# Patient Record
Sex: Female | Born: 1955 | Race: White | Hispanic: No | Marital: Married | Smoking: Never smoker
Health system: Southern US, Community
[De-identification: ages and names within clinical notes are randomized; demographics above are authoritative.]

## PROBLEM LIST (undated history)

## (undated) DIAGNOSIS — M1990 Unspecified osteoarthritis, unspecified site: Secondary | ICD-10-CM

## (undated) DIAGNOSIS — I10 Essential (primary) hypertension: Secondary | ICD-10-CM

## (undated) DIAGNOSIS — E669 Obesity, unspecified: Secondary | ICD-10-CM

## (undated) DIAGNOSIS — N952 Postmenopausal atrophic vaginitis: Secondary | ICD-10-CM

## (undated) DIAGNOSIS — K219 Gastro-esophageal reflux disease without esophagitis: Secondary | ICD-10-CM

## (undated) DIAGNOSIS — N898 Other specified noninflammatory disorders of vagina: Principal | ICD-10-CM

## (undated) HISTORY — DX: Other specified noninflammatory disorders of vagina: N89.8

## (undated) HISTORY — PX: JOINT REPLACEMENT: SHX530

## (undated) HISTORY — DX: Postmenopausal atrophic vaginitis: N95.2

## (undated) HISTORY — DX: Unspecified osteoarthritis, unspecified site: M19.90

---

## 2008-02-23 ENCOUNTER — Encounter: Admission: RE | Admit: 2008-02-23 | Discharge: 2008-02-23 | Payer: Self-pay | Admitting: Orthopedic Surgery

## 2008-03-16 ENCOUNTER — Ambulatory Visit: Payer: Self-pay | Admitting: Cardiology

## 2008-06-15 ENCOUNTER — Inpatient Hospital Stay (HOSPITAL_COMMUNITY): Admission: RE | Admit: 2008-06-15 | Discharge: 2008-06-19 | Payer: Self-pay | Admitting: Orthopedic Surgery

## 2008-07-16 ENCOUNTER — Encounter: Admission: RE | Admit: 2008-07-16 | Discharge: 2008-09-13 | Payer: Self-pay | Admitting: Orthopedic Surgery

## 2008-11-09 ENCOUNTER — Inpatient Hospital Stay (HOSPITAL_COMMUNITY): Admission: RE | Admit: 2008-11-09 | Discharge: 2008-11-12 | Payer: Self-pay | Admitting: Orthopedic Surgery

## 2008-12-10 ENCOUNTER — Encounter: Admission: RE | Admit: 2008-12-10 | Discharge: 2009-03-10 | Payer: Self-pay | Admitting: Orthopedic Surgery

## 2009-05-04 HISTORY — PX: TOTAL KNEE ARTHROPLASTY: SHX125

## 2010-08-10 LAB — COMPREHENSIVE METABOLIC PANEL
AST: 23 U/L (ref 0–37)
Albumin: 2.7 g/dL — ABNORMAL LOW (ref 3.5–5.2)
Alkaline Phosphatase: 128 U/L — ABNORMAL HIGH (ref 39–117)
BUN: 13 mg/dL (ref 6–23)
BUN: 5 mg/dL — ABNORMAL LOW (ref 6–23)
GFR calc Af Amer: >60 mL/min (ref >60)
GFR calc non Af Amer: >60 mL/min (ref >60)
Glucose, Bld: 134 mg/dL — ABNORMAL HIGH (ref 70–99)
Potassium: 4.2 mEq/L (ref 3.5–5.1)
Potassium: 4.6 mEq/L (ref 3.5–5.1)
Total Bilirubin: 1.2 mg/dL (ref 0.3–1.2)
Total Protein: 6.9 g/dL (ref 6.0–8.3)

## 2010-08-10 LAB — URINALYSIS, ROUTINE W REFLEX MICROSCOPIC
Glucose, UA: NEGATIVE mg/dL
Nitrite: NEGATIVE
Specific Gravity, Urine: 1.026 (ref 1.005–1.030)
Urobilinogen, UA: 1 mg/dL (ref 0.0–1.0)
pH: 5 (ref 5.0–8.0)

## 2010-08-10 LAB — CBC
HCT: 29.4 % — ABNORMAL LOW (ref 36.0–46.0)
HCT: 29.5 % — ABNORMAL LOW (ref 36.0–46.0)
HCT: 30.4 % — ABNORMAL LOW (ref 36.0–46.0)
HCT: 39.6 % (ref 36.0–46.0)
Hemoglobin: 10.2 g/dL — ABNORMAL LOW (ref 12.0–15.0)
Hemoglobin: 10.6 g/dL — ABNORMAL LOW (ref 12.0–15.0)
Hemoglobin: 13.5 g/dL (ref 12.0–15.0)
MCHC: 34.6 g/dL (ref 30.0–36.0)
MCV: 84.2 fL (ref 78.0–100.0)
MCV: 84.3 fL (ref 78.0–100.0)
MCV: 84.6 fL (ref 78.0–100.0)
Platelets: 217 10*3/uL (ref 150–400)
RBC: 3.49 MIL/uL — ABNORMAL LOW (ref 3.87–5.11)
RBC: 3.62 MIL/uL — ABNORMAL LOW (ref 3.87–5.11)
RBC: 4.69 MIL/uL (ref 3.87–5.11)
RDW: 14.4 % (ref 11.5–15.5)
RDW: 14.6 % (ref 11.5–15.5)
WBC: 8.2 10*3/uL (ref 4.0–10.5)
WBC: 8.5 10*3/uL (ref 4.0–10.5)
WBC: 9.6 10*3/uL (ref 4.0–10.5)

## 2010-08-10 LAB — DIFFERENTIAL
Eosinophils Absolute: 0.4 10*3/uL (ref 0.0–0.7)
Lymphocytes Relative: 32 % (ref 12–46)
Lymphs Abs: 2.6 10*3/uL (ref 0.7–4.0)
Monocytes Relative: 8 % (ref 3–12)
Neutrophils Relative %: 55 % (ref 43–77)

## 2010-08-10 LAB — URINE CULTURE: Colony Count: 30000

## 2010-08-10 LAB — PROTIME-INR: INR: 1 (ref 0.00–1.49)

## 2010-08-10 LAB — BASIC METABOLIC PANEL
Chloride: 103 mEq/L (ref 96–112)
Creatinine, Ser: 0.7 mg/dL (ref 0.4–1.2)
Creatinine, Ser: 0.85 mg/dL (ref 0.4–1.2)
Potassium: 4.4 mEq/L (ref 3.5–5.1)
Sodium: 139 mEq/L (ref 135–145)

## 2010-08-10 LAB — APTT: aPTT: 29 seconds (ref 24–37)

## 2010-08-10 LAB — URINE MICROSCOPIC-ADD ON

## 2010-08-19 LAB — CBC
HCT: 28.8 % — ABNORMAL LOW (ref 36.0–46.0)
Hemoglobin: 10.2 g/dL — ABNORMAL LOW (ref 12.0–15.0)
Hemoglobin: 14.5 g/dL (ref 12.0–15.0)
Hemoglobin: 9.4 g/dL — ABNORMAL LOW (ref 12.0–15.0)
MCHC: 35.1 g/dL (ref 30.0–36.0)
MCHC: 35.5 g/dL (ref 30.0–36.0)
MCHC: 35 g/dL (ref 30.0–36.0)
MCV: 87.5 fL (ref 78.0–100.0)
MCV: 88.6 fL (ref 78.0–100.0)
Platelets: 313 10*3/uL (ref 150–400)
RBC: 3.25 MIL/uL — ABNORMAL LOW (ref 3.87–5.11)
RBC: 3.53 MIL/uL — ABNORMAL LOW (ref 3.87–5.11)
RBC: 3.05 MIL/uL — ABNORMAL LOW (ref 3.87–5.11)
RDW: 12.7 % (ref 11.5–15.5)
WBC: 7.8 10*3/uL (ref 4.0–10.5)

## 2010-08-19 LAB — APTT: aPTT: 31 seconds (ref 24–37)

## 2010-08-19 LAB — URINALYSIS, ROUTINE W REFLEX MICROSCOPIC
Glucose, UA: NEGATIVE mg/dL
Hgb urine dipstick: NEGATIVE
Protein, ur: NEGATIVE mg/dL
Urobilinogen, UA: 0.2 mg/dL (ref 0.0–1.0)
pH: 5 (ref 5.0–8.0)

## 2010-08-19 LAB — COMPREHENSIVE METABOLIC PANEL
ALT: 46 U/L — ABNORMAL HIGH (ref 0–35)
ALT: 22 U/L (ref 0–35)
AST: 19 U/L (ref 0–37)
Albumin: 4.1 g/dL (ref 3.5–5.2)
Albumin: 2.5 g/dL — ABNORMAL LOW (ref 3.5–5.2)
Alkaline Phosphatase: 101 U/L (ref 39–117)
BUN: 12 mg/dL (ref 6–23)
CO2: 30 mEq/L (ref 19–32)
Calcium: 8.4 mg/dL (ref 8.4–10.5)
GFR calc Af Amer: >60 mL/min (ref >60)
Potassium: 4.7 mEq/L (ref 3.5–5.1)
Sodium: 141 mEq/L (ref 135–145)
Sodium: 137 mEq/L (ref 135–145)
Total Protein: 6.8 g/dL (ref 6.0–8.3)
Total Protein: 5.5 g/dL — ABNORMAL LOW (ref 6.0–8.3)

## 2010-08-19 LAB — BASIC METABOLIC PANEL
CO2: 29 mEq/L (ref 19–32)
CO2: 30 mEq/L (ref 19–32)
Calcium: 8.1 mg/dL — ABNORMAL LOW (ref 8.4–10.5)
Calcium: 8.4 mg/dL (ref 8.4–10.5)
Chloride: 98 mEq/L (ref 96–112)
Creatinine, Ser: 0.77 mg/dL (ref 0.4–1.2)
Creatinine, Ser: 0.89 mg/dL (ref 0.4–1.2)
GFR calc Af Amer: >60 mL/min (ref >60)
GFR calc Af Amer: >60 mL/min (ref >60)
Glucose, Bld: 129 mg/dL — ABNORMAL HIGH (ref 70–99)
Potassium: 4 mEq/L (ref 3.5–5.1)
Sodium: 135 mEq/L (ref 135–145)
Sodium: 136 mEq/L (ref 135–145)
Sodium: 138 mEq/L (ref 135–145)

## 2010-08-19 LAB — ABO/RH: ABO/RH(D): A POS

## 2010-08-19 LAB — TYPE AND SCREEN
ABO/RH(D): A POS
Antibody Screen: NEGATIVE

## 2010-08-19 LAB — URINE MICROSCOPIC-ADD ON

## 2010-08-19 LAB — URINE CULTURE

## 2010-09-16 NOTE — Op Note (Signed)
NAMEHENDY, [name]                 ACCOUNT NO.:  [id number]   MEDICAL RECORD NO.:  [id number]          PATIENT TYPE:  INP   LOCATION:  5003                         FACILITY:  [facility]   PHYSICIAN:  Dyke Brackett, M.D.    DATE OF BIRTH:  1956-03-21   DATE OF PROCEDURE:  06/15/2008  DATE OF DISCHARGE:                               OPERATIVE REPORT   INDICATIONS:  A 55 year old morbid obesity, 5 feet 5 inches,  approximately 300 pounds with a BMI of greater than 50 making technical  degree of difficulty this case as a primary knee significant a greater  than that normal.   PREOPERATIVE DIAGNOSES:  1. Severe osteoarthritis, left knee.  2. Morbid obesity.   POSTOPERATIVE DIAGNOSES:  1. Severe osteoarthritis, left knee.  2. Morbid obesity.   OPERATION:  1. DePuy LCS cemented knee (standard plus femur-patella, 15-mm bearing      with a size 4 tibial tray.  2. Repair of patellar tendon takedown.   SURGEON:  Dyke Brackett, MD   ASSISTANT:  Sharol Given, PA   TOURNIQUET TIME:  1 hour 10 minutes.   PROCEDURE:  Extended approach to the knee was made in terms of  undermining tissue through a straight skin incision, medial parapatellar  approach.  Exposure was significantly more difficult due to the size of  the knee.  A medial parapatellar approach to the knee was made and a  limited patellar turndown carried out distally and later repaired with a  Mitek anchor due to difficulty of exposure.  Tibia was cut about 2 mm  below the most diseased medial compartment with a 7-degree posterior  slope.  This was followed by an anterior-posterior femoral cut with a  flexion gap eventually measured at 15 mm followed by a distal 4-degree  valgus cut.  Finishing guide was next placed with the keel holes for  that as well as the chamfers.   Attention was next directed at the femur of the tibia.  Tibia was  exposed and excess remnants of the menisci were removed.  PCL was  completely released.   Keel hole was cut for the LCS and a trial tibial  baseplate and bearing eventually sized to 15 mm was placed with a trial  femur.  Patella was cut due to again difficulty exposing the patella  with a freehand technique leaving 13 mm of native patella to accept  patellar trial.  Slight hyperextension was noted with the trial in but  no instability noted.  Flexion to 100 limited by the patient's size and  again good stability to varus and valgus.  Trials were removed.  The  final components were inserted with cement in the doughy state,  2  batches of cement with 1.2 g of vancomycin per batch using antibiotic-  impregnated cement.  Cement was allowed to harden, excess cement was  removed, and tibia was cemented followed by femur patella.  A trial  bearing was placed to allow exposure of the posterior history of the  knee.  When the cement was hardened, this trial bearing was  removed.  Tourniquet was released.  No excessive bleeding was noted.  Excess  cement was removed for the back of the knee, then the final bearing  placed and again all parameters were checked with the knee extended and  flexed.  The patellar retinaculum and capsule was closed with  interrupted Ethibond.  Limited takedown of the patellar tendon was  repaired with a Mitek anchor at the attachment of the patellar tendon,  subcutaneous tissues with 0 and 2-0 Vicryl skin clips on the skin  distally, Hemovac drain exiting superolaterally.  She was taken to the  recovery room in stable condition.      Dyke Brackett, M.D.  Electronically Signed     WDC/MEDQ  D:  06/15/2008  T:  06/15/2008  Job:  [id number]

## 2010-09-16 NOTE — Assessment & Plan Note (Signed)
[facility]                            CARDIOLOGY OFFICE NOTE   NAME:[name], [name]                        MRN:          [id number]  DATE:03/16/2008                            DOB:          04/02/1956    PRIMARY CARE PHYSICIAN:  Lindaann Pascal, PA, [facility].   REFERRING PHYSICIAN:  Dyke Brackett, M.D.   REASON FOR PRESENTATION:  Preoperative evaluation in a patient with  cardiovascular risk factors.   HISTORY OF PRESENT ILLNESS:  The patient is a pleasant 55 year old white  female without prior cardiac history.  She does have borderline  hypertension.  She has morbid obesity.  She is limited in her functional  status by knee pain.  She is going to have surgery on one or both of her  knees.  This has not yet been scheduled.  She is able to climb a flight  of stairs slowly.  She can vacuum (greater than 5 minutes).  With this  level of activity she has never had any chest discomfort, neck or arm  discomfort.  She has never had any chest pressure, palpitation,  presyncope or syncope.  She thinks her breathing is okay.  She does not  describe any PND or orthopnea.  She has recently had some borderline  blood pressures and some proteinuria.  These are both being followed.   PAST MEDICAL HISTORY:  Borderline hypertension as described.  There has  been no history of diabetes or hyperlipidemia.   PAST SURGICAL HISTORY:  None.   ALLERGIES:  PENICILLIN.   MEDICATIONS:  Meloxicam, hydrocodone.   SOCIAL HISTORY:  The patient is a housewife.  She is married.  She has  no children.  She has never smoked cigarettes.  She does not drink  alcohol.   FAMILY HISTORY:  Is contributory for a brother with diabetes who had  stents placed in his 37s.  Her father had rheumatic heart disease.  He  has valve replacement.  He also apparently has an ICD and had later  onset coronary disease in his 58s.   REVIEW OF SYSTEMS:  As stated in the  HPI, positive for occasional  headaches and constipation, snoring, joint pains as described.  Negative  for all other systems.   PHYSICAL EXAMINATION:  GENERAL:  The patient is in no distress.  VITAL SIGNS:  Blood pressure 124/84, heart rate 69 and regular.  HEENT:  Eyes unremarkable.  Pupils equal, round and reactive to light.  Fundi within normal limits.  Oral mucosa unremarkable.  NECK:  No jugular venous distention at 45 degrees, carotid upstroke  brisk and symmetrical.  No bruits, no thyromegaly.  LYMPHATICS:  No cervical, axillary or inguinal adenopathy.  LUNGS:  Clear to auscultation bilaterally.  BACK:  No costovertebral angle tenderness.  CHEST:  Unremarkable.  HEART:  PMI not displaced or sustained, S1-S2 within normal limits, no  S3, no S4, no clicks, no rubs, no murmurs.  ABDOMEN:  Obese, positive bowel sounds, normal in frequency and pitch,  no bruits, no rebound, no guarding or midline pulsatile mass.  No  hepatomegaly.  No splenomegaly.  SKIN:  No rashes, no nodules.  EXTREMITIES:  2+ pulses throughout, no edema, cyanosis or clubbing.  NEURO:  Oriented to person, place, time.  Cranial nerves II-XII grossly  intact, motor grossly intact.   EKG sinus rhythm, rate 69, incomplete right bundle branch block, no  acute ST-T wave changes.   ASSESSMENT AND PLAN:  1. Preoperative clearance.  The patient has a reasonable functional      level (greater than 5 minutes).  She has no high risk physical      findings or history.  She has no ongoing symptoms.  She is going      for moderate risk surgical procedure.  Therefore, based on this and      according to ACC/AHA guidelines, the patient is at acceptable risk      for the planned surgery.  No further cardiovascular testing is      suggested.  She should have usual preoperative and postoperative      precautions taken.  2. Obesity.  We discussed the need to lose weight with diet and      exercise.  Hopefully she can comply  with this as her knees get      fixed.  3. Hypertension.  This is borderline.  Certainly the first step would      be weight loss.  She does snore but does not have any evidence      otherwise of sleep apnea.  This could be considered though if she      has blood pressure in the future.   FOLLOWUP:  I would be happy to see her as needed.     Rollene Rotunda, MD, [facility]  Electronically Signed    JH/MedQ  DD: 03/16/2008  DT: 03/16/2008  Job #: [id number]   cc:   Dyke Brackett, M.D.

## 2010-09-16 NOTE — Discharge Summary (Signed)
[name], [name]                 ACCOUNT NO.:  [id number]   MEDICAL RECORD NO.:  [id number]          PATIENT TYPE:  INP   LOCATION:  5003                         FACILITY:  [facility]   PHYSICIAN:  Dyke Brackett, M.D.    DATE OF BIRTH:  04-Feb-1956   DATE OF ADMISSION:  06/15/2008  DATE OF DISCHARGE:  06/19/2008                               DISCHARGE SUMMARY   ADMITTING DIAGNOSIS:  Left knee osteoarthritis.   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   PROCEDURE:  The patient taken to the OR on June 15, 2008, for left  knee osteoarthritis, had a left total knee replacement and transferred  to the PACU in stable condition.   HOSPITAL COURSE:  Ms. [name] is a 55 year old female who comes to the  hospital for left knee osteoarthritis, taken to the OR, had a left total  knee replacement, and patellar tendon repair.  The patient was then  brought to the PACU in stable condition.  First postoperative day was on  June 16, 2008, the patient was doing well.  Hemoglobin 10.9.  Vital  signs were stable, afebrile.  No complaints.  Minimal pain.  Second  postop day was on June 17, 2008, vital signs stable, afebrile once  again.  Hemoglobin 10.2, white blood cell count 9.2.  Hemovac was pulled  and dressing was changed and CPM 0-90, doing well, 50% weightbearing  with physical therapy.  Plan to discharge home on Monday.  Postoperative  day #3 was on June 18, 2008, the patient was still using the pain  pump as apparently they had discontinued the PCA and having a fair  amount of pain with pain pumps between 7-8 level roughly, due to this we  decided that we need to discharge the pain pump, at least one day prior  to sending her home.  Hemoglobin is 9.4.  Vital signs were stable,  afebrile.  Wound was benign and had negative Homans sign on exam.  She  is getting 0-90 in CPMs and doing 50% weightbearing, plan to go home on  June 19, 2008.  Postoperative day #4 was June 19, 2008,  hemoglobin was 9.4 and stable.  Vital signs were stable, afebrile.  The  patient doing better with pain off the pain pump, therefore I plan to  discharge home, 50% weightbearing and follow up in Dr. Candise Bowens office  in 10 days for staple removal.   ASSESSMENT AND PLAN:  Ms. [name] is a 55 year old female status post  left total knee replacement.  Discharged home in stable condition on  regular diet, 50% weightbearing as tolerated on left total knee  replacement, CPM 0-90, 6-8 hours a day increase 2-3 degrees per day.  Plan for follow up at Dr. Candise Bowens office in 10 days for staple  removal.  The patient got home health physical therapy, be on Lovenox  for 14 days postoperatively.   DISCHARGE MEDICATIONS:  1. Lovenox 40 mg subcu at 8 a.m. each day for a total of 14 days      postoperatively.  2. Robaxin 500 mg 1 tablet p.o. q.6-8 h. p.r.n. pain.  3. Percocet 5/325 one to two tablets p.o. q.4-6 h. p.r.n. pain.   Apparently, the patient had no other home medications, it just the ones  that we started on.      Sharol Given, [place]      Dyke Brackett, M.D.  Electronically Signed    JBS/MEDQ  D:  07/25/2008  T:  07/26/2008  Job:  [id number]

## 2010-09-16 NOTE — Discharge Summary (Signed)
NAMEJOEY, HUDOCK                 ACCOUNT NO.:  [id number]   MEDICAL RECORD NO.:  [id number]          PATIENT TYPE:  INP   LOCATION:  5037                         FACILITY:  [facility]   PHYSICIAN:  Dyke Brackett, M.D.    DATE OF BIRTH:  10/05/1955   DATE OF ADMISSION:  11/09/2008  DATE OF DISCHARGE:  11/12/2008                               DISCHARGE SUMMARY   ADMISSION DIAGNOSIS:  Right knee osteoarthritis.   DISCHARGING FINAL DIAGNOSES:  Status post right total knee replacement  and right knee osteoarthritis.   PROCEDURE NOTE:  The patient was brought to the operating room.  Right  total knee replacement was performed by Dr. Madelon Lips and Kateri Plummer,  PA, assistant.  The patient tolerated procedure well.  No complications.  She was transferred to the PACU in stable condition.  Minimal blood  loss.   HOSPITAL COURSE:  Ms. Weiler is a 55 year old female.  Postoperative day  #1 was on November 10, 2008.  The patient was doing well at that point.  Hemoglobin at 10.6.  Vital signs were stable.  She was afebrile having  some minimal tenderness in her right lower leg, otherwise everything was  going fine at that point.  Therapy started and the patient was 50%  weightbearing on postoperative day #2 on November 11, 2008.  Hemoglobin at  10.2.  Vital signs stable, afebrile.  Exam is benign.  Postoperative day  #3 was on November 12, 2008, passing gas.  At that point, no bowel movement.  Hemoglobin 10.2.  Vital signs stable, afebrile, getting to 55 degrees  with CPM machine at that point.  Plan for discharge today.  She was  doing well.   ASSESSMENT/PLAN:  Ms. Couillard is a 55 year old female status post right  total knee replacement discharged on November 12, 2008, in stable condition,  50% weightbearing, follow up in the office 14 days postoperatively for  staple removal, Lovenox postoperatively for 14 days 40 mg subcu daily at  8 a.m. for DVT prophylaxis and the patient's discharge medications would  be as  follows:  1. Lovenox 40 mg subcu at 8 a.m. each day for a total of as mentioned      14 days postoperatively.  2. Norco 7.5/325 one to two tablets p.o. q.4-6 h. p.r.n. pain.  3. Robaxin 500 mg 1 tablet p.o. q.6-8 h. p.r.n. pain.  4. Meloxicam was not restarted actually and her vitamin and laxatives      was so.      Sharol Given, PA      Dyke Brackett, M.D.  Electronically Signed    JBS/MEDQ  D:  11/28/2008  T:  11/29/2008  Job:  [id number]

## 2010-09-16 NOTE — Op Note (Signed)
NAMEANAYELLI, LAI                 ACCOUNT NO.:  [id number]   MEDICAL RECORD NO.:  [id number]          PATIENT TYPE:  INP   LOCATION:  5037                         FACILITY:  [facility]   PHYSICIAN:  Dyke Brackett, M.D.    DATE OF BIRTH:  [birth year]   DATE OF PROCEDURE:  11/09/2008  DATE OF DISCHARGE:                               OPERATIVE REPORT   PREOPERATIVE DIAGNOSES:  1. Morbid obesity.  2. Severe osteoarthritis, right knee.   POSTOPERATIVE DIAGNOSES:  1. Morbid obesity.  2. Severe osteoarthritis, right knee.   OPERATION:  Right total knee replacement (LCS standard plus femur, size  4 tibia, 15-mm bearing standard plus patella).   SURGEON:  Dyke Brackett, MD   ASSISTANT:  Joslyn Devon. Smith, PA   TOURNIQUET TIME:  102 minutes.   DESCRIPTION OF PROCEDURE:  It should be noted that technical degree of  difficulty due to the patient's height approximately 5 and 280 pounds  made the technical degree of difficulty in this case significant,  although the case was accomplished without complications.  She was  approached to an extended anterior incision due to the patient's body  habitus.  A straight skin incision extended, medial parapatellar  approach to the knee was made, patella could not be everted.  It was  retracted laterally.  There was significant osteoarthritis with complete  collapse of the medial side of the knee, was also aware laterally and  patellofemorally was severe, tibia was cut, and revision tibia cut to  allow creation of a flexion gap, eventually 15 mm.  The anterior-  posterior femoral cut was cut with a match to create a flexion gap, then  a 4-degree distal cut with the flexion gap eventually measuring the  extension gap at 15 mm.  Medial stripping of the medial compartment was  carried out due to contracture of the medial side, and actually this had  to be done on the lateral side as well to balance the knee as well.   After cutting the distal femur, the  chamfers were cut as well as the  pegs for the prosthesis.  Attention was next directed to the tibia.  Again, noted contraction of the structures particularly due to the  posterior sagging of the femur due to the patient's size, made  manipulation of the knee extremely difficult, although no complications  were encountered.  The keel hole was cut through the tibia after excess  menisci were removed, and then the size 4 trial placed with a standard  plus femur, patella 3-peg trial.  Once all the releases and balancing  was carried out, the knee did obtain full extension with slight  hyperextension and flexed with slightly positive drawer with no tendency  for bearing spin-out.  The components were removed.  Wound was  irrigated, pulsatile lavage used on the bone, cement was placed into the  tibia and femur, components were cemented into place with final bearing  at the above-mentioned size.  Excess cement was removed from the knee.  Again, balancing was deemed to be excellent with full  extension,  slight hyperextension, good varus and valgus stability.  No  tendency for bearing spin-out.  In view of the preoperative femoral  nerve block, no Marcaine was used.  Closure on the capsule was effected  with #1 Ethibond with 0 and 2-0 Vicryl.  Hemovac drain was placed and  skin staples, taken to recovery room in stable condition.       Dyke Brackett, M.D.  Electronically Signed     WDC/MEDQ  D:  11/09/2008  T:  11/09/2008  Job:  [id number]

## 2011-06-13 ENCOUNTER — Emergency Department (HOSPITAL_COMMUNITY)
Admission: EM | Admit: 2011-06-13 | Discharge: 2011-06-14 | Disposition: A | Discharge status: Home / Self Care | Payer: Commercial Indemnity | Attending: Emergency Medicine | Admitting: Emergency Medicine

## 2011-06-13 ENCOUNTER — Encounter (HOSPITAL_COMMUNITY): Payer: Self-pay | Admitting: *Deleted

## 2011-06-13 ENCOUNTER — Emergency Department (HOSPITAL_COMMUNITY): Payer: Commercial Indemnity

## 2011-06-13 DIAGNOSIS — R1011 Right upper quadrant pain: Secondary | ICD-10-CM | POA: Insufficient documentation

## 2011-06-13 DIAGNOSIS — E669 Obesity, unspecified: Secondary | ICD-10-CM | POA: Insufficient documentation

## 2011-06-13 DIAGNOSIS — K8020 Calculus of gallbladder without cholecystitis without obstruction: Secondary | ICD-10-CM

## 2011-06-13 DIAGNOSIS — R1110 Vomiting, unspecified: Secondary | ICD-10-CM | POA: Insufficient documentation

## 2011-06-13 HISTORY — DX: Obesity, unspecified: E66.9

## 2011-06-13 LAB — HEPATIC FUNCTION PANEL
Bilirubin, Direct: 0.9 mg/dL — ABNORMAL HIGH (ref 0.0–0.3)
Indirect Bilirubin: 0.7 mg/dL (ref 0.3–0.9)
Total Bilirubin: 1.6 mg/dL — ABNORMAL HIGH (ref 0.3–1.2)

## 2011-06-13 LAB — DIFFERENTIAL
Basophils Absolute: 0 10*3/uL (ref 0.0–0.1)
Basophils Relative: 0 % (ref 0–1)
Eosinophils Relative: 2 % (ref 0–5)
Monocytes Absolute: 1 10*3/uL (ref 0.1–1.0)

## 2011-06-13 LAB — CBC
HCT: 41.7 % (ref 36.0–46.0)
MCH: 29.3 pg (ref 26.0–34.0)
MCHC: 34.3 g/dL (ref 30.0–36.0)
MCV: 85.5 fL (ref 78.0–100.0)
RDW: 13.4 % (ref 11.5–15.5)

## 2011-06-13 LAB — URINALYSIS, ROUTINE W REFLEX MICROSCOPIC
Hgb urine dipstick: NEGATIVE
Ketones, ur: 15 mg/dL — AB
Protein, ur: NEGATIVE mg/dL
Urobilinogen, UA: >8 mg/dL — ABNORMAL HIGH (ref 0.0–1.0)

## 2011-06-13 LAB — URINE MICROSCOPIC-ADD ON

## 2011-06-13 LAB — POCT I-STAT, CHEM 8
Calcium, Ion: 1.12 mmol/L (ref 1.12–1.32)
Creatinine, Ser: 0.7 mg/dL (ref 0.50–1.10)
Hemoglobin: 15 g/dL (ref 12.0–15.0)
Sodium: 140 mEq/L (ref 135–145)
TCO2: 28 mmol/L (ref 0–100)

## 2011-06-13 MED ORDER — MORPHINE SULFATE 4 MG/ML IJ SOLN
4.0000 mg | Freq: Once | INTRAMUSCULAR | Status: AC
  Administered 2011-06-13: 4 mg via INTRAVENOUS
  Filled 2011-06-13: qty 1

## 2011-06-13 MED ORDER — IOHEXOL 300 MG/ML  SOLN
100.0000 mL | Freq: Once | INTRAMUSCULAR | Status: AC | PRN
  Administered 2011-06-13: 100 mL via INTRAVENOUS

## 2011-06-13 MED ORDER — SODIUM CHLORIDE 0.9 % IV BOLUS (SEPSIS)
1000.0000 mL | Freq: Once | INTRAVENOUS | Status: AC
  Administered 2011-06-13: 1000 mL via INTRAVENOUS

## 2011-06-13 NOTE — ED Provider Notes (Signed)
History     CSN: [id number]  Arrival date & time 06/13/11  7829   First MD Initiated Contact with Patient 06/13/11 1908      Chief Complaint  Patient presents with  . Abdominal Pain    (Consider location/radiation/quality/duration/timing/severity/associated sxs/prior treatment) HPI Patient is a 56 year old female who presents today complaining of several days of right upper quadrant abdominal pain. She denies any nausea or vomiting. She says the pain is a 10 out of 10. It is worse with palpation as well as breathing. She has no history of prior abdominal surgeries. She denies any urinary symptoms or vaginal discharge. Patient does not have any associated chest pain or cough. She denies any fevers. She has had this one other time and thought she had eaten something that had gotten to her. The pain went away at that time. This time it is compact and has not left. Patient denies any history of medical problems that she is morbidly obese. She reports that she does followup with a doctor. [place] There are no other associated or modifying factors. Past Medical History  Diagnosis Date  . Obesity     History reviewed. No pertinent past surgical history.  History reviewed. No pertinent family history.  History  Substance Use Topics  . Smoking status: Not on file  . Smokeless tobacco: Not on file  . Alcohol Use: No    OB History    Grav Para Term Preterm Abortions TAB SAB Ect Mult Living                  Review of Systems  Constitutional: Negative.   HENT: Negative.   Eyes: Negative.   Respiratory: Negative.   Gastrointestinal: Positive for abdominal pain.  Genitourinary: Negative.   Musculoskeletal: Negative.   Neurological: Negative.   Hematological: Negative.   Psychiatric/Behavioral: Negative.   All other systems reviewed and are negative.    Allergies  Penicillins  Home Medications  No current outpatient prescriptions on file.  BP  147/80  Pulse 81  Temp(Src) 98.2 F (36.8 C) (Oral)  Resp 20  SpO2 96%  Physical Exam  Nursing note and vitals reviewed. GEN: Well-developed, obese female in no distress HEENT: Atraumatic, normocephalic. Oropharynx clear without erythema EYES: PERRLA BL, no scleral icterus. NECK: Trachea midline, no meningismus CV: regular rate and rhythm. No murmurs, rubs, or gallops PULM: No respiratory distress.  No crackles, wheezes, or rales. GI: soft, mildly tender to palpation over the right upper quadrant. No guarding, rebound, or tenderness. + bowel sounds  Neuro: cranial nerves 2-12 intact, no abnormalities of strength or sensation, A and O x 3 MSK: Patient moves all 4 extremities symmetrically, no deformity, edema, or injury noted Psych: no abnormality of mood   ED Course  Procedures (including critical care time)  Labs Reviewed  POCT I-STAT, CHEM 8 - Abnormal; Notable for the following:    Glucose, Bld 113 (*)    All other components within normal limits  CBC  DIFFERENTIAL  HEPATIC FUNCTION PANEL  LIPASE, BLOOD  URINALYSIS, ROUTINE W REFLEX MICROSCOPIC       MDM  Patient was evaluated by myself. Based on her evaluation patient did have lab workup for abdominal pain. She was given pain medication IV fluids. CBC showed no leukocytosis, anemia, thrombocytopenia. I-STAT was also within normal limits. Remainder of workup is pending at this time. Patient will have right upper quadrant ultrasound performed. Care has been signed out to PA [place]  Dammen. Please see his addendum to this record with patient's final disposition.        Cyndra Numbers, MD 06/13/11 2017

## 2011-06-13 NOTE — ED Notes (Signed)
Reports having right side pain for several days, became more severe today after walking. N/v on wed, denies diarrhea. No acute distress noted at triage.

## 2011-06-14 MED ORDER — OXYCODONE-ACETAMINOPHEN 5-325 MG PO TABS: 1.0000 | ORAL_TABLET | Freq: Four times a day (QID) | ORAL | Status: AC | PRN

## 2011-06-14 MED ORDER — MORPHINE SULFATE 4 MG/ML IJ SOLN
4.0000 mg | Freq: Once | INTRAMUSCULAR | Status: AC
  Administered 2011-06-14: 4 mg via INTRAVENOUS
  Filled 2011-06-14: qty 1

## 2011-06-14 NOTE — ED Provider Notes (Signed)
Pt seen and examined, labs reviewed along with xrays, pt to be d/c with pain meds and f/u with gen surg for gallstones  Toy Baker, MD 06/14/11 0021

## 2011-06-14 NOTE — ED Provider Notes (Signed)
Medical screening examination/treatment/procedure(s) were conducted as a shared visit with non-physician practitioner(s) and myself.  I personally evaluated the patient during the encounter   Jayke Caul, MD 06/14/11 1146 

## 2011-06-14 NOTE — ED Provider Notes (Signed)
Addalie Calles S 8:00PM  patient discussed in sign out with Dr. Alto Denver. Patient is a 56 year old female presents with right upper quadrant pains for past several days that if worsened today. Pain does seem to be worse with movements and walking and also occasionally after meal. Symptoms are also associated with some vomiting. Plan to evaluate patient for possible gallstones or cholecystitis. Labs an ultrasound pending. You may also consider CAT scan if ultrasound negative.   Ultrasound is unremarkable. Will plan to get CT scan for further evaluation. Patient reports being comfortable while lying still having some pains. Will also re\re dose morphine.   CAT scan shows possibility for small gallstones. Patient continues to have no pain while lying still and is comfortable. Her labs are otherwise unremarkable with no elevated WBC. Patient is afebrile. At this time we have discussed option of returning home with pain medications and his general surgery referral. Patient agrees with this plan and will call on Monday for a followup appointment for additional evaluation and treatment.    Angus Seller, PA 06/14/11 Rich Fuchs

## 2011-06-26 ENCOUNTER — Encounter (INDEPENDENT_AMBULATORY_CARE_PROVIDER_SITE_OTHER): Payer: Self-pay | Admitting: General Surgery

## 2011-06-26 ENCOUNTER — Ambulatory Visit (INDEPENDENT_AMBULATORY_CARE_PROVIDER_SITE_OTHER): Payer: Commercial Indemnity | Admitting: General Surgery

## 2011-06-26 VITALS — BP 140/80 | HR 77 | Temp 96.6°F | Ht 65.0 in | Wt 282.4 lb

## 2011-06-26 DIAGNOSIS — K8020 Calculus of gallbladder without cholecystitis without obstruction: Secondary | ICD-10-CM

## 2011-06-26 NOTE — Patient Instructions (Signed)

## 2011-06-26 NOTE — Progress Notes (Signed)
Patient ID: [name], female   DOB: 12/01/1955, 55 y.o.   MRN: [id number]  Chief Complaint  Patient presents with  . Pre-op Exam    eval GB with stones    HPI [name] is a 55 y.o. female.  HPI  89-year-old morbidly obese Caucasian female referred by Dr. Hunt for evaluation for gallstones. The patient states that she has had some intermittent pain on and off for the past several weeks. She states that she had some right upper quadrant discomfort underneath her right rib several weeks ago on Wednesday evening. It was associated with some nausea. The pain decreased however, she was still tender. The following Saturday she went to the circus and developed severe pain on her right side. It got to the point where she could walk due to the pain. She had no nausea or vomiting with this episode. The pain was sharp and severe and eventually radiated to her back. It prompted her to go to the emergency room where she was evaluated with an abdominal ultrasound labs, and CT scan. She states about a month ago she had some nausea and vomiting if she ate something greasy. She denies any weight loss. She denies any pain or difficulty swallowing. She denies any melena or hematochezia. She denies any jaundice. She denies any acholic stools. She had to take the pain medication last week after leaving the ER because of continued pain on the right side. However the pain has resolved now. She is still limiting and watching what she eats. She denies any jaundice.  Past Medical History  Diagnosis Date  . Obesity   . Arthritis     Past Surgical History  Procedure Date  . Total knee arthroplasty 2011    Family History  Problem Relation Age of Onset  . Cancer Mother     lung  . Diabetes Father     Social History History  Substance Use Topics  . Smoking status: Never Smoker   . Smokeless tobacco: Not on file  . Alcohol Use: No    Allergies  Allergen Reactions  . Penicillins Rash    No  current outpatient prescriptions on file.    Review of Systems Review of Systems  Constitutional: Positive for appetite change. Negative for fever, activity change and unexpected weight change.  HENT: Negative for hearing loss, nosebleeds and neck pain.   Eyes: Negative for photophobia and visual disturbance.  Respiratory: Negative for chest tightness and shortness of breath.        No PND. No SOB. Some DOE  Cardiovascular: Negative for chest pain, palpitations and leg swelling.  Gastrointestinal:       See hpi  Genitourinary: Negative for dysuria and hematuria.  Musculoskeletal: Positive for back pain. Negative for joint swelling and gait problem.  Neurological: Negative for tremors, seizures, weakness and headaches.  Hematological: Does not bruise/bleed easily.  Psychiatric/Behavioral: Negative.     Blood pressure 140/80, pulse 77, temperature 96.6 F (35.9 C), temperature source Temporal, height 5' 5" (1.651 m), weight 282 lb 6.4 oz (128.096 kg), SpO2 96.00%.  Physical Exam Physical Exam  Vitals reviewed. Constitutional: She is oriented to person, place, and time. She appears well-developed and well-nourished. No distress.       Morbid obesity  HENT:  Head: Normocephalic and atraumatic.  Right Ear: External ear normal.  Left Ear: External ear normal.  Eyes: Conjunctivae are normal. No scleral icterus.  Neck: Normal range of motion. Neck supple. No tracheal deviation present.   No thyromegaly present.  Cardiovascular: Normal rate, regular rhythm, normal heart sounds and intact distal pulses.   Pulmonary/Chest: Effort normal and breath sounds normal. No respiratory distress. She has no wheezes.  Abdominal: Soft. Bowel sounds are normal. She exhibits no distension. There is no rebound and no guarding.       Obese. Very mild TTP in RUQ  Musculoskeletal: Normal range of motion. She exhibits no edema.  Lymphadenopathy:    She has no cervical adenopathy.  Neurological: She is alert  and oriented to person, place, and time. She exhibits normal muscle tone.  Skin: Skin is warm and dry. She is not diaphoretic.  Psychiatric: She has a normal mood and affect. Her behavior is normal. Judgment and thought content normal.    Data Reviewed ED note from 2/13  CT ABDOMEN AND PELVIS WITH CONTRAST  Technique: Multidetector CT imaging of the abdomen and pelvis was  performed following the standard protocol during bolus  administration of intravenous contrast.  Contrast: 100mL OMNIPAQUE IOHEXOL 300 MG/ML IV SOLN  Comparison: None.  Findings: Mild dependent atelectasis in the lung bases.  Small stones are present in the dependent portion of the  gallbladder. No gallbladder wall thickening or bile duct  dilatation. The liver contour appears somewhat nodular which might  suggest hepatic cirrhosis. Spleen size is increased without focal  lesion. Consider portal hypertension. The pancreas, adrenal  glands, kidneys, abdominal aorta, and retroperitoneal lymph nodes  are unremarkable. The stomach and small bowel are not dilated.  Stool filled colon without distension or wall thickening. No free  air or free fluid in the abdomen.  Pelvis: Uterus and adnexal structures are not enlarged. Bladder  wall is not thickened. No significant lymphadenopathy. No  inflammatory change in the sigmoid colon. Appendix is normal. No  free or loculated pelvic fluid collections. Degenerative changes  in the lumbar spine with normal alignment.  IMPRESSION:  Cholelithiasis. Nodular hepatic contour suggesting cirrhosis.  Mild splenic enlargement may represent portal venous hypertension.  No acute inflammatory process demonstrated in the abdomen or  pelvis. Normal appendix.  ABDOMINAL U/S  LABS from 2/9- tbili 1.6; ast 57, alt 56, ap 193  Assessment    Symptomatic cholelithiasis, probable chronic cholecystitis Morbid obesity Fatty liver infiltration    Plan    We discussed gallbladder disease.  The patient was given educational material. We discussed non-operative and operative management. We discussed signs and symptoms of acute cholecystitis  The patient is interested in pursuing surgery.  I have recommended that we repeat her labs to make sure her LFTs are improved. IF still abnormal, I will ask for a preop GI medicine consult for MRCP vs ERCP.  Some of her LFT elevation could be due to her fatty infiltration of her liver; however, her total bilirubin and alk phos were slightly elevated on her most recent blood work.   I discussed laparoscopic cholecystectomy with IOC in detail.  The patient was given educational material as well as diagrams detailing the procedure.  We discussed the risks and benefits of a laparoscopic cholecystectomy including, but not limited to bleeding, infection, injury to surrounding structures such as the intestine or liver, bile leak, retained gallstones, need to convert to an open procedure, prolonged diarrhea, blood clots such as  DVT, common bile duct injury, anesthesia risks, and possible need for additional procedures.  We discussed the typical post-operative recovery course. I explained that the likelihood of improvement of their symptoms is good.  She will be scheduled for Lap   cholecystectomy with IOC assuming her preop labs are improved.   Kristyn Obyrne M. Aurther Harlin, MD, FACS General, Bariatric, & Minimally Invasive Surgery [facility]         Sandrika Schwinn M 06/26/2011, 3:38 PM    

## 2011-06-29 ENCOUNTER — Encounter (HOSPITAL_COMMUNITY): Payer: Self-pay | Admitting: Pharmacy Technician

## 2011-07-03 ENCOUNTER — Encounter (HOSPITAL_COMMUNITY)
Admission: RE | Admit: 2011-07-03 | Discharge: 2011-07-03 | Disposition: A | Discharge status: Home / Self Care | Payer: Managed Care, Other (non HMO) | Source: Ambulatory Visit | Attending: General Surgery | Admitting: General Surgery

## 2011-07-03 ENCOUNTER — Encounter (HOSPITAL_COMMUNITY): Payer: Self-pay

## 2011-07-03 LAB — COMPREHENSIVE METABOLIC PANEL
ALT: 43 U/L — ABNORMAL HIGH (ref 0–35)
AST: 55 U/L — ABNORMAL HIGH (ref 0–37)
Albumin: 3.7 g/dL (ref 3.5–5.2)
Alkaline Phosphatase: 141 U/L — ABNORMAL HIGH (ref 39–117)
BUN: 9 mg/dL (ref 6–23)
CO2: 27 mEq/L (ref 19–32)
Calcium: 9.7 mg/dL (ref 8.4–10.5)
Chloride: 103 mEq/L (ref 96–112)
Creatinine, Ser: 0.73 mg/dL (ref 0.50–1.10)
GFR calc Af Amer: >90 mL/min (ref >90)
GFR calc non Af Amer: >90 mL/min (ref >90)
Glucose, Bld: 116 mg/dL — ABNORMAL HIGH (ref 70–99)
Potassium: 4 mEq/L (ref 3.5–5.1)
Sodium: 139 mEq/L (ref 135–145)
Total Bilirubin: 0.8 mg/dL (ref 0.3–1.2)
Total Protein: 7.2 g/dL (ref 6.0–8.3)

## 2011-07-03 LAB — APTT: aPTT: 30 seconds (ref 24–37)

## 2011-07-03 LAB — CBC
Hemoglobin: 13.6 g/dL (ref 12.0–15.0)
MCHC: 34 g/dL (ref 30.0–36.0)
Platelets: 348 10*3/uL (ref 150–400)
RBC: 4.75 MIL/uL (ref 3.87–5.11)

## 2011-07-03 LAB — DIFFERENTIAL
Eosinophils Absolute: 0.3 10*3/uL (ref 0.0–0.7)
Eosinophils Relative: 5 % (ref 0–5)
Lymphocytes Relative: 42 % (ref 12–46)
Lymphs Abs: 2.6 10*3/uL (ref 0.7–4.0)
Monocytes Absolute: 0.6 10*3/uL (ref 0.1–1.0)

## 2011-07-03 LAB — SURGICAL PCR SCREEN
MRSA, PCR: NEGATIVE
Staphylococcus aureus: POSITIVE — AB

## 2011-07-03 LAB — PROTIME-INR: Prothrombin Time: 13.6 seconds (ref 11.6–15.2)

## 2011-07-03 NOTE — Pre-Procedure Instructions (Signed)
20 [name]  07/03/2011   Your procedure is scheduled on:   Thursday  07/09/11    Report to [facility] at 930 AM.  Call this number if you have problems the morning of surgery: [phone]   Remember:   Do not eat food:After Midnight.  May have clear liquids: up to 4 Hours before arrival.  Clear liquids include soda, tea, black coffee, apple or grape juice, broth.  Take these medicines the morning of surgery with A SIP OF WATER:  STOP ASPIRIN, COUMADIN, PLAVIX, EFFIENT, HERBAL MEDICINES   Do not wear jewelry, make-up or nail polish.  Do not wear lotions, powders, or perfumes. You may wear deodorant.  Do not shave 48 hours prior to surgery.  Do not bring valuables to the hospital.  Contacts, dentures or bridgework may not be worn into surgery.  Leave suitcase in the car. After surgery it may be brought to your room.  For patients admitted to the hospital, checkout time is 11:00 AM the day of discharge.   Patients discharged the day of surgery will not be allowed to drive home.  Name and phone number of your driver:  Special Instructions: CHG Shower Use Special Wash: 1/2 bottle night before surgery and 1/2 bottle morning of surgery.   Please read over the following fact sheets that you were given: Pain Booklet, Total Joint Packet and MRSA Information

## 2011-07-06 NOTE — Consult Note (Signed)
Anesthesia:  Patient is a 56 year old female scheduled for a laparoscopic cholecystectomy on 07/09/11.  History includes obesity, arthritis, TKA, non-smoker.  She does not see a Cardiologist routinely, but was evaluated by Dr. Rollene Rotunda in 2009 for pre-operative evaluation prior to scheduling TKA.  No specific cardiac testing was recommended at that time.  She did not meet Anesthesia's guidelines for pre-operative EKG or CXR.  Labs noted.  Plan to proceed.

## 2011-07-08 MED ORDER — CIPROFLOXACIN IN D5W 400 MG/200ML IV SOLN
400.0000 mg | INTRAVENOUS | Status: AC
  Administered 2011-07-09: 400 mg via INTRAVENOUS
  Filled 2011-07-08: qty 200

## 2011-07-08 MED ORDER — CHLORHEXIDINE GLUCONATE 4 % EX LIQD
1.0000 "application " | Freq: Once | CUTANEOUS | Status: DC
  Filled 2011-07-08: qty 15

## 2011-07-09 ENCOUNTER — Encounter (HOSPITAL_COMMUNITY): Payer: Self-pay | Admitting: Vascular Surgery

## 2011-07-09 ENCOUNTER — Encounter (HOSPITAL_COMMUNITY): Payer: Self-pay | Admitting: *Deleted

## 2011-07-09 ENCOUNTER — Encounter (HOSPITAL_COMMUNITY)
Admission: RE | Disposition: A | Discharge status: Home / Self Care | Payer: Self-pay | Source: Ambulatory Visit | Attending: General Surgery

## 2011-07-09 ENCOUNTER — Ambulatory Visit (HOSPITAL_COMMUNITY): Payer: Managed Care, Other (non HMO) | Admitting: Vascular Surgery

## 2011-07-09 ENCOUNTER — Ambulatory Visit (HOSPITAL_COMMUNITY): Payer: Managed Care, Other (non HMO)

## 2011-07-09 ENCOUNTER — Ambulatory Visit (HOSPITAL_COMMUNITY)
Admission: RE | Admit: 2011-07-09 | Discharge: 2011-07-10 | Disposition: A | Discharge status: Home / Self Care | Payer: Managed Care, Other (non HMO) | Source: Ambulatory Visit | Attending: General Surgery | Admitting: General Surgery

## 2011-07-09 DIAGNOSIS — K8010 Calculus of gallbladder with chronic cholecystitis without obstruction: Secondary | ICD-10-CM

## 2011-07-09 DIAGNOSIS — Z9049 Acquired absence of other specified parts of digestive tract: Secondary | ICD-10-CM

## 2011-07-09 DIAGNOSIS — Z01812 Encounter for preprocedural laboratory examination: Secondary | ICD-10-CM | POA: Insufficient documentation

## 2011-07-09 HISTORY — PX: CHOLECYSTECTOMY: SHX55

## 2011-07-09 SURGERY — LAPAROSCOPIC CHOLECYSTECTOMY WITH INTRAOPERATIVE CHOLANGIOGRAM
Anesthesia: General | Wound class: Contaminated

## 2011-07-09 MED ORDER — GLYCOPYRROLATE 0.2 MG/ML IJ SOLN
INTRAMUSCULAR | Status: DC | PRN
  Administered 2011-07-09 (×2): 0.4 mg via INTRAVENOUS

## 2011-07-09 MED ORDER — PHENYLEPHRINE HCL 10 MG/ML IJ SOLN
10.0000 mg | INTRAVENOUS | Status: DC | PRN
  Administered 2011-07-09: 40 ug via INTRAVENOUS

## 2011-07-09 MED ORDER — ACETAMINOPHEN 650 MG RE SUPP: 650.0000 mg | RECTAL | Status: DC | PRN

## 2011-07-09 MED ORDER — LIDOCAINE HCL (CARDIAC) 20 MG/ML IV SOLN
INTRAVENOUS | Status: DC | PRN
  Administered 2011-07-09: 80 mg via INTRAVENOUS

## 2011-07-09 MED ORDER — ONDANSETRON HCL 4 MG/2ML IJ SOLN: 4.0000 mg | Freq: Four times a day (QID) | INTRAMUSCULAR | Status: DC | PRN

## 2011-07-09 MED ORDER — SODIUM CHLORIDE 0.9 % IR SOLN
Status: DC | PRN
  Administered 2011-07-09: 1000 mL

## 2011-07-09 MED ORDER — ONDANSETRON HCL 4 MG/2ML IJ SOLN
INTRAMUSCULAR | Status: DC | PRN
  Administered 2011-07-09: 4 mg via INTRAVENOUS

## 2011-07-09 MED ORDER — HYDROMORPHONE HCL PF 1 MG/ML IJ SOLN
INTRAMUSCULAR | Status: AC
  Filled 2011-07-09: qty 1

## 2011-07-09 MED ORDER — BUPIVACAINE-EPINEPHRINE 0.25% -1:200000 IJ SOLN
INTRAMUSCULAR | Status: DC | PRN
  Administered 2011-07-09: 24 mL

## 2011-07-09 MED ORDER — HYDROMORPHONE HCL PF 1 MG/ML IJ SOLN
0.2500 mg | INTRAMUSCULAR | Status: DC | PRN
Start: 2011-07-09 — End: 2011-07-09
  Administered 2011-07-09 (×3): 0.5 mg via INTRAVENOUS

## 2011-07-09 MED ORDER — LACTATED RINGERS IV SOLN
INTRAVENOUS | Status: DC | PRN
  Administered 2011-07-09 (×2): via INTRAVENOUS

## 2011-07-09 MED ORDER — SODIUM CHLORIDE 0.9 % IJ SOLN: 3.0000 mL | Freq: Two times a day (BID) | INTRAMUSCULAR | Status: DC

## 2011-07-09 MED ORDER — 0.9 % SODIUM CHLORIDE (POUR BTL) OPTIME
TOPICAL | Status: DC | PRN
  Administered 2011-07-09: 1000 mL

## 2011-07-09 MED ORDER — IOHEXOL 300 MG/ML  SOLN
INTRAMUSCULAR | Status: DC | PRN
  Administered 2011-07-09: 16 mL via INTRAVENOUS

## 2011-07-09 MED ORDER — MORPHINE SULFATE 4 MG/ML IJ SOLN
INTRAMUSCULAR | Status: AC
  Administered 2011-07-09: 2 mg via INTRAVENOUS
  Filled 2011-07-09: qty 1

## 2011-07-09 MED ORDER — MEPERIDINE HCL 25 MG/ML IJ SOLN: 6.2500 mg | INTRAMUSCULAR | Status: DC | PRN

## 2011-07-09 MED ORDER — DEXTROSE 5 % IV SOLN
INTRAVENOUS | Status: DC | PRN
  Administered 2011-07-09: 11:00:00 via INTRAVENOUS

## 2011-07-09 MED ORDER — SODIUM CHLORIDE 0.9 % IJ SOLN: 3.0000 mL | INTRAMUSCULAR | Status: DC | PRN

## 2011-07-09 MED ORDER — ROCURONIUM BROMIDE 100 MG/10ML IV SOLN
INTRAVENOUS | Status: DC | PRN
  Administered 2011-07-09: 50 mg via INTRAVENOUS
  Administered 2011-07-09: 20 mg via INTRAVENOUS

## 2011-07-09 MED ORDER — MIDAZOLAM HCL 5 MG/5ML IJ SOLN
INTRAMUSCULAR | Status: DC | PRN
  Administered 2011-07-09: 2 mg via INTRAVENOUS

## 2011-07-09 MED ORDER — SODIUM CHLORIDE 0.9 % IV SOLN: 250.0000 mL | INTRAVENOUS | Status: DC | PRN

## 2011-07-09 MED ORDER — OXYCODONE HCL 5 MG PO TABS
ORAL_TABLET | ORAL | Status: AC
Start: 2011-07-09 — End: 2011-07-09
  Administered 2011-07-09: 10 mg via ORAL
  Filled 2011-07-09: qty 2

## 2011-07-09 MED ORDER — NEOSTIGMINE METHYLSULFATE 1 MG/ML IJ SOLN
INTRAMUSCULAR | Status: DC | PRN
  Administered 2011-07-09: 2 mg via INTRAVENOUS
  Administered 2011-07-09: 3 mg via INTRAVENOUS

## 2011-07-09 MED ORDER — OXYCODONE-ACETAMINOPHEN 5-325 MG PO TABS: 1.0000 | ORAL_TABLET | ORAL | Status: AC | PRN

## 2011-07-09 MED ORDER — PROPOFOL 10 MG/ML IV EMUL
INTRAVENOUS | Status: DC | PRN
  Administered 2011-07-09: 200 mg via INTRAVENOUS

## 2011-07-09 MED ORDER — PROMETHAZINE HCL 25 MG/ML IJ SOLN: 6.2500 mg | INTRAMUSCULAR | Status: DC | PRN

## 2011-07-09 MED ORDER — FENTANYL CITRATE 0.05 MG/ML IJ SOLN
INTRAMUSCULAR | Status: DC | PRN
  Administered 2011-07-09: 100 ug via INTRAVENOUS
  Administered 2011-07-09: 50 ug via INTRAVENOUS

## 2011-07-09 MED ORDER — MORPHINE SULFATE 2 MG/ML IJ SOLN
1.0000 mg | INTRAMUSCULAR | Status: DC | PRN
  Administered 2011-07-09: 3 mg via INTRAVENOUS
  Filled 2011-07-09: qty 2

## 2011-07-09 MED ORDER — OXYCODONE HCL 5 MG PO TABS
5.0000 mg | ORAL_TABLET | ORAL | Status: DC | PRN
  Administered 2011-07-09 – 2011-07-10 (×3): 10 mg via ORAL
  Filled 2011-07-09 (×3): qty 2

## 2011-07-09 MED ORDER — ACETAMINOPHEN 325 MG PO TABS: 650.0000 mg | ORAL_TABLET | ORAL | Status: DC | PRN

## 2011-07-09 SURGICAL SUPPLY — 44 items
APPLIER CLIP 5 13 M/L LIGAMAX5 (MISCELLANEOUS) ×2
BANDAGE ADHESIVE 1X3 (GAUZE/BANDAGES/DRESSINGS) ×6 IMPLANT
BENZOIN TINCTURE PRP APPL 2/3 (GAUZE/BANDAGES/DRESSINGS) ×2 IMPLANT
BLADE SURG ROTATE 9660 (MISCELLANEOUS) IMPLANT
CANISTER SUCTION 2500CC (MISCELLANEOUS) ×2 IMPLANT
CHLORAPREP W/TINT 26ML (MISCELLANEOUS) ×2 IMPLANT
CLIP APPLIE 5 13 M/L LIGAMAX5 (MISCELLANEOUS) ×1 IMPLANT
CLOTH BEACON ORANGE TIMEOUT ST (SAFETY) ×2 IMPLANT
COVER MAYO STAND STRL (DRAPES) IMPLANT
COVER SURGICAL LIGHT HANDLE (MISCELLANEOUS) ×2 IMPLANT
DECANTER SPIKE VIAL GLASS SM (MISCELLANEOUS) ×2 IMPLANT
DERMABOND ADHESIVE PROPEN (GAUZE/BANDAGES/DRESSINGS) ×1
DERMABOND ADVANCED .7 DNX6 (GAUZE/BANDAGES/DRESSINGS) ×1 IMPLANT
DRAPE C-ARM 42X72 X-RAY (DRAPES) IMPLANT
DRAPE UTILITY 15X26 W/TAPE STR (DRAPE) ×4 IMPLANT
DRSG TEGADERM 4X4.75 (GAUZE/BANDAGES/DRESSINGS) ×2 IMPLANT
ELECT REM PT RETURN 9FT ADLT (ELECTROSURGICAL) ×2
ELECTRODE REM PT RTRN 9FT ADLT (ELECTROSURGICAL) ×1 IMPLANT
GAUZE SPONGE 2X2 8PLY STRL LF (GAUZE/BANDAGES/DRESSINGS) ×1 IMPLANT
GLOVE BIOGEL M STRL SZ7.5 (GLOVE) ×2 IMPLANT
GLOVE BIOGEL PI IND STRL 8 (GLOVE) ×1 IMPLANT
GLOVE BIOGEL PI INDICATOR 8 (GLOVE) ×1
GOWN STRL NON-REIN LRG LVL3 (GOWN DISPOSABLE) ×4 IMPLANT
GOWN STRL REIN XL XLG (GOWN DISPOSABLE) ×2 IMPLANT
HEMOSTAT SNOW SURGICEL 2X4 (HEMOSTASIS) ×2 IMPLANT
KIT BASIN OR (CUSTOM PROCEDURE TRAY) ×2 IMPLANT
KIT ROOM TURNOVER OR (KITS) ×2 IMPLANT
NS IRRIG 1000ML POUR BTL (IV SOLUTION) ×2 IMPLANT
PAD ARMBOARD 7.5X6 YLW CONV (MISCELLANEOUS) ×4 IMPLANT
POUCH SPECIMEN RETRIEVAL 10MM (ENDOMECHANICALS) ×2 IMPLANT
SCISSORS LAP 5X35 DISP (ENDOMECHANICALS) IMPLANT
SET CHOLANGIOGRAPH 5 50 .035 (SET/KITS/TRAYS/PACK) IMPLANT
SET IRRIG TUBING LAPAROSCOPIC (IRRIGATION / IRRIGATOR) ×2 IMPLANT
SLEEVE ENDOPATH XCEL 5M (ENDOMECHANICALS) ×4 IMPLANT
SPECIMEN JAR SMALL (MISCELLANEOUS) ×2 IMPLANT
SPONGE GAUZE 2X2 STER 10/PKG (GAUZE/BANDAGES/DRESSINGS) ×1
SUT MNCRL AB 4-0 PS2 18 (SUTURE) ×2 IMPLANT
SUT VICRYL 0 UR6 27IN ABS (SUTURE) IMPLANT
TOWEL OR 17X24 6PK STRL BLUE (TOWEL DISPOSABLE) ×2 IMPLANT
TOWEL OR 17X26 10 PK STRL BLUE (TOWEL DISPOSABLE) ×2 IMPLANT
TRAY LAPAROSCOPIC (CUSTOM PROCEDURE TRAY) ×2 IMPLANT
TROCAR XCEL BLUNT TIP 100MML (ENDOMECHANICALS) ×2 IMPLANT
TROCAR XCEL NON-BLD 5MMX100MML (ENDOMECHANICALS) ×2 IMPLANT
WATER STERILE IRR 1000ML POUR (IV SOLUTION) IMPLANT

## 2011-07-09 NOTE — Anesthesia Preprocedure Evaluation (Signed)
Anesthesia Evaluation  Patient identified by MRN, date of birth, ID band Patient awake    Reviewed: Allergy & Precautions, H&P , NPO status , Patient's Chart, lab work & pertinent test results  History of Anesthesia Complications Negative for: history of anesthetic complications  Airway Mallampati: I  Neck ROM: Full    Dental  (+) Teeth Intact   Pulmonary neg pulmonary ROS,  breath sounds clear to auscultation        Cardiovascular negative cardio ROS  Rhythm:Regular Rate:Normal     Neuro/Psych negative neurological ROS     GI/Hepatic Neg liver ROS,   Endo/Other  Morbid obesity  Renal/GU negative Renal ROS     Musculoskeletal  (+) Arthritis -,   Abdominal (+) + obese,   Peds  Hematology negative hematology ROS (+)   Anesthesia Other Findings   Reproductive/Obstetrics                           Anesthesia Physical Anesthesia Plan  ASA: II  Anesthesia Plan: General   Post-op Pain Management:    Induction: Intravenous  Airway Management Planned: Oral ETT  Additional Equipment:   Intra-op Plan:   Post-operative Plan: Extubation in OR  Informed Consent: I have reviewed the patients History and Physical, chart, labs and discussed the procedure including the risks, benefits and alternatives for the proposed anesthesia with the patient or authorized representative who has indicated his/her understanding and acceptance.   Dental advisory given  Plan Discussed with: CRNA and Surgeon  Anesthesia Plan Comments:         Anesthesia Quick Evaluation

## 2011-07-09 NOTE — Transfer of Care (Signed)
Immediate Anesthesia Transfer of Care Note  Patient: [name]  Procedure(s) Performed: Procedure(s) (LRB): LAPAROSCOPIC CHOLECYSTECTOMY WITH INTRAOPERATIVE CHOLANGIOGRAM (N/A)  Patient Location: PACU  Anesthesia Type: General  Level of Consciousness: awake, alert  and oriented  Airway & Oxygen Therapy: Patient Spontanous Breathing and Patient connected to nasal cannula oxygen  Post-op Assessment: Report given to PACU RN, Post -op Vital signs reviewed and stable and Patient moving all extremities  Post vital signs: Reviewed and stable  Complications: No apparent anesthesia complications

## 2011-07-09 NOTE — Discharge Instructions (Signed)
[facility] LAPAROSCOPIC SURGERY: POST OP INSTRUCTIONS Always review your discharge instruction sheet given to you by the facility where your surgery was performed. IF YOU HAVE DISABILITY OR FAMILY LEAVE FORMS, YOU MUST BRING THEM TO THE OFFICE FOR PROCESSING.   DO NOT GIVE THEM TO YOUR DOCTOR.  1. A prescription for pain medication may be given to you upon discharge.  Take your pain medication as prescribed, if needed.  If narcotic pain medicine is not needed, then you may take acetaminophen (Tylenol) or ibuprofen (Advil) as needed. 2. Take your usually prescribed medications unless otherwise directed. 3. If you need a refill on your pain medication, please contact your pharmacy.  They will contact our office to request authorization. Prescriptions will not be filled after 5pm or on week-ends. 4. You should follow a light diet the first few days after arrival home, such as soup and crackers, etc.  Be sure to include lots of fluids daily. 5. Most patients will experience some swelling and bruising in the area of the incisions.  Ice packs will help.  Swelling and bruising can take several days to resolve.  6. It is common to experience some constipation if taking pain medication after surgery.  Increasing fluid intake and taking a stool softener (such as Colace) will usually help or prevent this problem from occurring.  A mild laxative (Milk of Magnesia or Miralax) should be taken according to package instructions if there are no bowel movements after 48 hours. 7.  If your surgeon used skin glue on the incision, you may shower in 24 hours.  The glue will flake off over the next 2-3 weeks.  Any sutures or staples will be removed at the office during your follow-up visit. 8. ACTIVITIES:  You may resume regular (light) daily activities beginning the next day--such as daily self-care, walking, climbing stairs--gradually increasing activities as tolerated.  You may have sexual  intercourse when it is comfortable.  Refrain from any heavy lifting or straining until approved by your doctor. a. You may drive when you are no longer taking prescription pain medication, you can comfortably wear a seatbelt, and you can safely maneuver your car and apply brakes. 9. You should see your doctor in the office for a follow-up appointment approximately 2-3 weeks after your surgery.  Make sure that you call for this appointment within a day or two after you arrive home to insure a convenient appointment time. 10. OTHER INSTRUCTIONS:none  WHEN TO CALL YOUR DOCTOR: 1. Fever over 101.0 2. Inability to urinate 3. Continued bleeding from incision. 4. Increased pain, redness, or drainage from the incision. 5. Increasing abdominal pain  The clinic staff is available to answer your questions during regular business hours.  Please don't hesitate to call and ask to speak to one of the nurses for clinical concerns.  If you have a medical emergency, go to the nearest emergency room or call 911.  A surgeon from [facility] is always on call at the hospital. [address], [place], [place]  [address] ? [address] [address], [place], [place]   [address] [phone] ? [phone] ? FAX [phone] Web site: [email or web address]

## 2011-07-09 NOTE — Interval H&P Note (Signed)
History and Physical Interval Note:  07/09/2011 10:40 AM  [name]  has presented today for surgery, with the diagnosis of symptomatic cholelithiasis  The various methods of treatment have been discussed with the patient and family. After consideration of risks, benefits and other options for treatment, the patient has consented to  Procedure(s) (LRB): LAPAROSCOPIC CHOLECYSTECTOMY WITH INTRAOPERATIVE CHOLANGIOGRAM (N/A) as a surgical intervention .  The patients' history has been reviewed, patient examined, no change in status, stable for surgery.  I have reviewed the patients' chart and labs.  Questions were answered to the patient's satisfaction.    [name]. Andrey Campanile, MD, FACS General, Bariatric, & Minimally Invasive Surgery [facility] [place]   [facility] M

## 2011-07-09 NOTE — H&P (View-Only) (Signed)
Patient ID: [name], female   DOB: 10-Dec-1955, 56 y.o.   MRN: [id number]  Chief Complaint  Patient presents with  . Pre-op Exam    eval GB with stones    HPI [name] is a 56 y.o. female.  HPI  56 year old morbidly obese Caucasian female referred by Dr. Alto Denver for evaluation for gallstones. The patient states that she has had some intermittent pain on and off for the past several weeks. She states that she had some right upper quadrant discomfort underneath her right rib several weeks ago on Wednesday evening. It was associated with some nausea. The pain decreased however, she was still tender. The following Saturday she went to the circus and developed severe pain on her right side. It got to the point where she could walk due to the pain. She had no nausea or vomiting with this episode. The pain was sharp and severe and eventually radiated to her back. It prompted her to go to the emergency room where she was evaluated with an abdominal ultrasound labs, and CT scan. She states about a month ago she had some nausea and vomiting if she ate something greasy. She denies any weight loss. She denies any pain or difficulty swallowing. She denies any melena or hematochezia. She denies any jaundice. She denies any acholic stools. She had to take the pain medication last week after leaving the ER because of continued pain on the right side. However the pain has resolved now. She is still limiting and watching what she eats. She denies any jaundice.  Past Medical History  Diagnosis Date  . Obesity   . Arthritis     Past Surgical History  Procedure Date  . Total knee arthroplasty 2011    Family History  Problem Relation Age of Onset  . Cancer Mother     lung  . Diabetes Father     Social History History  Substance Use Topics  . Smoking status: Never Smoker   . Smokeless tobacco: Not on file  . Alcohol Use: No    Allergies  Allergen Reactions  . Penicillins Rash    No  current outpatient prescriptions on file.    Review of Systems Review of Systems  Constitutional: Positive for appetite change. Negative for fever, activity change and unexpected weight change.  HENT: Negative for hearing loss, nosebleeds and neck pain.   Eyes: Negative for photophobia and visual disturbance.  Respiratory: Negative for chest tightness and shortness of breath.        No PND. No SOB. Some DOE  Cardiovascular: Negative for chest pain, palpitations and leg swelling.  Gastrointestinal:       See hpi  Genitourinary: Negative for dysuria and hematuria.  Musculoskeletal: Positive for back pain. Negative for joint swelling and gait problem.  Neurological: Negative for tremors, seizures, weakness and headaches.  Hematological: Does not bruise/bleed easily.  Psychiatric/Behavioral: Negative.     Blood pressure 140/80, pulse 77, temperature 96.6 F (35.9 C), temperature source Temporal, height 5\' 5"  (1.651 m), weight 282 lb 6.4 oz (128.096 kg), SpO2 96.00%.  Physical Exam Physical Exam  Vitals reviewed. Constitutional: She is oriented to person, place, and time. She appears well-developed and well-nourished. No distress.       Morbid obesity  HENT:  Head: Normocephalic and atraumatic.  Right Ear: External ear normal.  Left Ear: External ear normal.  Eyes: Conjunctivae are normal. No scleral icterus.  Neck: Normal range of motion. Neck supple. No tracheal deviation present.  No thyromegaly present.  Cardiovascular: Normal rate, regular rhythm, normal heart sounds and intact distal pulses.   Pulmonary/Chest: Effort normal and breath sounds normal. No respiratory distress. She has no wheezes.  Abdominal: Soft. Bowel sounds are normal. She exhibits no distension. There is no rebound and no guarding.       Obese. Very mild TTP in RUQ  Musculoskeletal: Normal range of motion. She exhibits no edema.  Lymphadenopathy:    She has no cervical adenopathy.  Neurological: She is alert  and oriented to person, place, and time. She exhibits normal muscle tone.  Skin: Skin is warm and dry. She is not diaphoretic.  Psychiatric: She has a normal mood and affect. Her behavior is normal. Judgment and thought content normal.    Data Reviewed ED note from 2/13  CT ABDOMEN AND PELVIS WITH CONTRAST  Technique: Multidetector CT imaging of the abdomen and pelvis was  performed following the standard protocol during bolus  administration of intravenous contrast.  Contrast: OMNIPAQUE IOHEXOL 300 MG/ML IV SOLN  Comparison: None.  Findings: Mild dependent atelectasis in the lung bases.  Small stones are present in the dependent portion of the  gallbladder. No gallbladder wall thickening or bile duct  dilatation. The liver contour appears somewhat nodular which might  suggest hepatic cirrhosis. Spleen size is increased without focal  lesion. Consider portal hypertension. The pancreas, adrenal  glands, kidneys, abdominal aorta, and retroperitoneal lymph nodes  are unremarkable. The stomach and small bowel are not dilated.  Stool filled colon without distension or wall thickening. No free  air or free fluid in the abdomen.  Pelvis: Uterus and adnexal structures are not enlarged. Bladder  wall is not thickened. No significant lymphadenopathy. No  inflammatory change in the sigmoid colon. Appendix is normal. No  free or loculated pelvic fluid collections. Degenerative changes  in the lumbar spine with normal alignment.  IMPRESSION:  Cholelithiasis. Nodular hepatic contour suggesting cirrhosis.  Mild splenic enlargement may represent portal venous hypertension.  No acute inflammatory process demonstrated in the abdomen or  pelvis. Normal appendix.  ABDOMINAL U/S  LABS from 2/9- tbili 1.6; ast 57, alt 56, ap 193  Assessment    Symptomatic cholelithiasis, probable chronic cholecystitis Morbid obesity Fatty liver infiltration    Plan    We discussed gallbladder disease.  The patient was given Agricultural engineer. We discussed non-operative and operative management. We discussed signs and symptoms of acute cholecystitis  The patient is interested in pursuing surgery.  I have recommended that we repeat her labs to make sure her LFTs are improved. IF still abnormal, I will ask for a preop GI medicine consult for MRCP vs ERCP.  Some of her LFT elevation could be due to her fatty infiltration of her liver; however, her total bilirubin and alk phos were slightly elevated on her most recent blood work.   I discussed laparoscopic cholecystectomy with IOC in detail.  The patient was given educational material as well as diagrams detailing the procedure.  We discussed the risks and benefits of a laparoscopic cholecystectomy including, but not limited to bleeding, infection, injury to surrounding structures such as the intestine or liver, bile leak, retained gallstones, need to convert to an open procedure, prolonged diarrhea, blood clots such as  DVT, common bile duct injury, anesthesia risks, and possible need for additional procedures.  We discussed the typical post-operative recovery course. I explained that the likelihood of improvement of their symptoms is good.  She will be scheduled for Lap  cholecystectomy with IOC assuming her preop labs are improved.   [name]. Andrey Campanile, MD, FACS General, Bariatric, & Minimally Invasive Surgery [facility] [place]         [facility] M 06/26/2011, 3:38 PM

## 2011-07-09 NOTE — Anesthesia Procedure Notes (Addendum)
Performed by: Julianne Rice K   Procedure Name: Intubation Date/Time: 07/09/2011 11:21 AM Performed by: Julianne Rice K Pre-anesthesia Checklist: Emergency Drugs available, Patient identified, Timeout performed, Suction available and Patient being monitored Patient Re-evaluated:Patient Re-evaluated prior to inductionOxygen Delivery Method: Circle system utilized Preoxygenation: Pre-oxygenation with 100% oxygen Intubation Type: IV induction Ventilation: Mask ventilation without difficulty Laryngoscope Size: Miller and 2 Grade View: Grade II Tube type: Oral Tube size: 7.5 mm Number of attempts: 1 Airway Equipment and Method: Stylet Placement Confirmation: ETT inserted through vocal cords under direct vision,  breath sounds checked- equal and bilateral and positive ETCO2 Secured at: 21 cm Tube secured with: Tape Dental Injury: Teeth and Oropharynx as per pre-operative assessment

## 2011-07-09 NOTE — Progress Notes (Addendum)
Pt has been unable to void.  States she is in too much pain to go home.  She states she her pain is a 4 before and after her pain meds.  Pt to be admitted for 23 hrs obs. Dr. Janee Morn notified.  Pt to go to 5154. Report called to Liz,R.N.

## 2011-07-09 NOTE — Anesthesia Postprocedure Evaluation (Signed)
  Anesthesia Post-op Note  Patient: [name]  Procedure(s) Performed: Procedure(s) (LRB): LAPAROSCOPIC CHOLECYSTECTOMY WITH INTRAOPERATIVE CHOLANGIOGRAM (N/A)  Patient Location: PACU  Anesthesia Type: General  Level of Consciousness: awake  Airway and Oxygen Therapy: Patient Spontanous Breathing  Post-op Pain: mild  Post-op Assessment: Post-op Vital signs reviewed  Post-op Vital Signs: stable  Complications: No apparent anesthesia complications

## 2011-07-09 NOTE — Progress Notes (Addendum)
Pt transported to [id number].

## 2011-07-09 NOTE — Op Note (Signed)
Laparoscopic Cholecystectomy with IOC Procedure Note  Indications: This patient presents with symptomatic gallbladder disease and will undergo laparoscopic cholecystectomy.  Pre-operative Diagnosis: Calculus of gallbladder with other cholecystitis, without mention of obstruction  Post-operative Diagnosis: Same  Surgeon: Atilano Ina   Assistants: none  Anesthesia: General endotracheal anesthesia and Local anesthesia 0.25.% bupivacaine, with epinephrine  ASA Class: 2  Procedure Details  The patient was seen again in the Holding Room. The risks, benefits, complications, treatment options, and expected outcomes were discussed with the patient. The possibilities of reaction to medication, pulmonary aspiration, perforation of viscus, bleeding, recurrent infection, finding a normal gallbladder, the need for additional procedures, failure to diagnose a condition, the possible need to convert to an open procedure, and creating a complication requiring transfusion or operation were discussed with the patient. The likelihood of improving the patient's symptoms with return to their baseline status is good.  The patient and/or family concurred with the proposed plan, giving informed consent. The site of surgery properly noted. The patient was taken to Operating Room, identified as [name] and the procedure verified as Laparoscopic Cholecystectomy with Intraoperative Cholangiogram. A Time Out was held and the above information confirmed.  Prior to the induction of general anesthesia, antibiotic prophylaxis was administered. General endotracheal anesthesia was then administered and tolerated well. After the induction, the abdomen was prepped with Chloraprep and draped in the sterile fashion. The patient was positioned in the supine position.  Local anesthetic agent was injected into the skin above the umbilicus and an incision made. We dissected down to the abdominal fascia with blunt dissection.  The  fascia was incised vertically and we entered the peritoneal cavity bluntly.  A pursestring suture of 0-Vicryl was placed around the fascial opening.  The Hasson cannula was inserted and secured with the stay suture.  Pneumoperitoneum was then created with CO2 and tolerated well without any adverse changes in the patient's vital signs. An 5-mm port was placed in the subxiphoid position.  Two 5-mm ports were placed in the right upper quadrant. All skin incisions were infiltrated with a local anesthetic agent before making the incision and placing the trocars.   We positioned the patient in reverse Trendelenburg, tilted slightly to the patient's left.  The liver was very nodular. There was omental adhesions covering the gallbladder. These were taken down with electrocautery. The gallbladder was identified, the fundus grasped and retracted cephalad. Adhesions were lysed bluntly and with the electrocautery where indicated, taking care not to injure any adjacent organs or viscus. A rent was made in the body of the gallbladder while trying to retract it with spillage of bile. There was no spillage of stones. The infundibulum was grasped and retracted laterally, exposing the peritoneum overlying the triangle of Calot. This was then divided and exposed in a blunt fashion. A critical view of the cystic duct and cystic artery was obtained.  The cystic duct was clearly identified and bluntly dissected circumferentially. The cystic duct was ligated with a clip distally.   An incision was made in the cystic duct and the [facility] cholangiogram catheter introduced. The catheter was secured using a clip. A cholangiogram was then obtained which showed good visualization of the distal and proximal biliary tree with no sign of filling defects or obstruction.  Contrast flowed easily into the duodenum. The catheter was then removed.   The cystic duct was then ligated with clips and divided. The cystic artery was identified, dissected  free, ligated with clips and divided as  well.   The gallbladder was dissected from the liver bed in retrograde fashion with the electrocautery. The gallbladder was removed and placed in an Endocatch sac.  The gallbladder and Endocatch sac were then removed through the umbilical port site. The liver bed was irrigated and inspected. Hemostasis was achieved with the electrocautery. A piece of ethicon Surgical SNoW was placed in the gallbladder fossa. Copious irrigation was utilized and was repeatedly aspirated until clear.  The pursestring suture was used to close the umbilical fascia.  I elected to place an additional interrupted 0-vicryl suture in the fascia.   We again inspected the right upper quadrant for hemostasis.  The umbilical closure was inspected and there was no air leak and nothing trapped within the closure. Pneumoperitoneum was released as we removed the trocars.  4-0 Monocryl was used to close the skin.   Dermabond was applied. The patient was then extubated and brought to the recovery room in stable condition. Instrument, sponge, and needle counts were correct at closure and at the conclusion of the case.   Findings: Chronic Cholecystitis with Cholelithiasis Very nodular liver  Estimated Blood Loss: Minimal         Drains: none         Specimens: Gallbladder           Complications: None; patient tolerated the procedure well.         Disposition: PACU - hemodynamically stable.         Condition: stable  [name]. Andrey Campanile, MD, FACS General, Bariatric, & Minimally Invasive Surgery [facility], [place]

## 2011-07-10 NOTE — Discharge Summary (Signed)
Physician Discharge Summary  Patient ID: [name] MRN: [id number] DOB/AGE: [birth year] 56 y.o.  Admit date: 07/09/2011 Discharge date: 07/10/2011  Admission Diagnoses: Morbid obesity Symptomatic cholelithiasis  Discharge Diagnoses:  Morbid obesity Chronic cholecystitis  Discharged Condition: fair  Hospital Course: Patient underwent laparoscopic cholecystectomy with IOC on 07/09/11. She was kept overnight because her pain in short stay was not controlled with oral pain medications and she had not urinated spontaneously. Overnight, she voided spontaneously. Her pain is now controlled with pain controls. She tolerated breakfast. Her vitals are stable  Consults: None  Significant Diagnostic Studies: none  Treatments: IV hydration, analgesia: Morphine and percocet and surgery: LAPAROSCOPIC CHOLECYSTECTOMY WITH CHOLANGIOGRAM  Discharge Exam: Blood pressure 160/79, pulse 98, temperature 98.4 F (36.9 C), temperature source Oral, resp. rate 18, SpO2 91.00%. Alert, nad cta b/l Reg Obese, soft, +BS, incisions c/d/i. Expected mild ttp.  No edema  Disposition: 01-Home or Self Care  Discharge Orders    Future Appointments: Provider: Department: Dept Phone: Center:   08/04/2011 10:15 AM Atilano Ina, MD,FACS [facility] [phone] None     Future Orders Please Complete By Expires   Diet - low sodium heart healthy      Increase activity slowly        Medication List  As of 07/10/2011  9:35 AM   TAKE these medications         oxyCODONE-acetaminophen 5-325 MG per tablet   Commonly known as: PERCOCET   Take 1-2 tablets by mouth every 4 (four) hours as needed for pain.           Follow-up Information    Follow up with Atilano Ina, MD,FACS. Schedule an appointment as soon as possible for a visit in 3 weeks.   Contact information:   [employer] [address], [address] [place] [address] [phone]         [name]. Andrey Campanile, MD,  FACS General, Bariatric, & Minimally Invasive Surgery [facility] Surgery, [place]  Signed: Atilano Ina 07/10/2011, 9:35 AM

## 2011-07-13 ENCOUNTER — Telehealth (INDEPENDENT_AMBULATORY_CARE_PROVIDER_SITE_OTHER): Payer: Self-pay | Admitting: General Surgery

## 2011-07-13 NOTE — Telephone Encounter (Signed)
Pt calling in to ask about ongoing site pain following chole on 3/07.  Pt report she was kept overnight the day of surgery, secondary to "pain control issues."  She denies N/V or fever.  Today she had "a good bowel movement."  She has tried to avoid taking the pain meds because they constipate her so badly.  I paged Dr. Andrey Campanile and updated him.  He advised (1) take the pain meds prescribed, or (2) can try Motrin or Tylenol in small amounts.  I called the patient back with his intructions.

## 2011-07-20 ENCOUNTER — Encounter (HOSPITAL_COMMUNITY): Payer: Self-pay | Admitting: General Surgery

## 2011-08-04 ENCOUNTER — Ambulatory Visit (INDEPENDENT_AMBULATORY_CARE_PROVIDER_SITE_OTHER): Payer: Managed Care, Other (non HMO) | Admitting: General Surgery

## 2011-08-04 ENCOUNTER — Encounter (INDEPENDENT_AMBULATORY_CARE_PROVIDER_SITE_OTHER): Payer: Self-pay | Admitting: General Surgery

## 2011-08-04 VITALS — BP 148/84 | HR 80 | Temp 97.6°F | Resp 16 | Ht 65.0 in | Wt 281.0 lb

## 2011-08-04 DIAGNOSIS — Z09 Encounter for follow-up examination after completed treatment for conditions other than malignant neoplasm: Secondary | ICD-10-CM

## 2011-08-04 NOTE — Patient Instructions (Signed)
Try to start exercising... Please discuss weight loss strategies with Dr Christell Constant

## 2011-08-04 NOTE — Progress Notes (Signed)
Chief complaint: Postop Procedure: Status post laparoscopic cholecystectomy with intraoperative cholangiogram March 7  History of Present Ilness: 56 year old morbidly obese Caucasian female comes in today for her postop appointment. She has no complaints today. She denies any fevers or chills. She denies any nausea or vomiting. She denies any right upper quadrant or epigastric pain. She is having a bowel movement every day. She denies any diarrhea. She is still taking some stool softeners. She denies any problems with her incisions. She reports a good appetite.  Physical Exam: BP 148/84  Pulse 80  Temp(Src) 97.6 F (36.4 C) (Temporal)  Resp 16  Ht 5\' 5"  (1.651 m)  Wt 281 lb (127.461 kg)  BMI 46.76 kg/m2  Gen: alert, NAD, non-toxic appearing, morbidly obese Pupils: equal, no scleral icterus Pulm: Lungs clear to auscultation, symmetric chest rise CV: regular rate and rhythm Abd: soft, nontender, nondistended. Well-healed trocar sites. No cellulitis. No incisional hernia Ext: no edema, no calf tenderness Skin: no rash, no jaundice  Pathology: Chronic cholecystitis and cholelithiasis  Assessment and Plan: Status post laparoscopic cholecystectomy with cholangiogram-doing well.  We discussed her pathology report.  I discussed the intraoperative findings with respect to her fatty liver. Her liver was very nodular. I discussed the importance of weight loss with respect to her fatty liver infiltration. I encouraged her to discuss this further with Dr. Christell Constant.  Followup p.r.n.  [name]. Andrey Campanile, MD, FACS General, Bariatric, & Minimally Invasive Surgery [facility] Surgery, [place]

## 2013-01-10 ENCOUNTER — Ambulatory Visit (INDEPENDENT_AMBULATORY_CARE_PROVIDER_SITE_OTHER): Payer: Commercial Indemnity

## 2013-01-10 ENCOUNTER — Encounter: Payer: Self-pay | Admitting: Family Medicine

## 2013-01-10 ENCOUNTER — Ambulatory Visit (INDEPENDENT_AMBULATORY_CARE_PROVIDER_SITE_OTHER): Payer: Commercial Indemnity | Admitting: Family Medicine

## 2013-01-10 VITALS — BP 154/83 | HR 71 | Temp 97.4°F | Ht 65.0 in | Wt 294.0 lb

## 2013-01-10 DIAGNOSIS — M25559 Pain in unspecified hip: Secondary | ICD-10-CM

## 2013-01-10 DIAGNOSIS — M545 Low back pain, unspecified: Secondary | ICD-10-CM

## 2013-01-10 DIAGNOSIS — M25552 Pain in left hip: Secondary | ICD-10-CM

## 2013-01-10 DIAGNOSIS — R109 Unspecified abdominal pain: Secondary | ICD-10-CM

## 2013-01-10 LAB — POCT URINALYSIS DIPSTICK
Blood, UA: NEGATIVE
Nitrite, UA: NEGATIVE
pH, UA: 5

## 2013-01-10 LAB — POCT UA - MICROSCOPIC ONLY: Yeast, UA: NEGATIVE

## 2013-01-10 MED ORDER — CYCLOBENZAPRINE HCL 10 MG PO TABS: 10.0000 mg | ORAL_TABLET | Freq: Three times a day (TID) | ORAL | Status: DC | PRN

## 2013-01-10 MED ORDER — PREDNISONE 50 MG PO TABS: ORAL_TABLET | ORAL | Status: DC

## 2013-01-10 NOTE — Progress Notes (Signed)
  Subjective:    Patient ID: [name], female    DOB: 1955/10/12, 57 y.o.   MRN: [id number]  HPI Patient presents today with chief complaint of low back and left hip pain. Symptoms have been present for the past 4 weeks. Patient has a baseline history of morbid obesity status post bilateral knee replacements. Patient states she has low back pain especially with movement has had some radiation to the left buttocks and left hip. No radicular symptoms. No bowel or bladder anesthesia. Has been able to bear weight. Pain seems to be worse with long-standing. Does have a spasmodic component to it. Patient denies any urinary complaints.   Review of Systems  All other systems reviewed and are negative.       Objective:   Physical Exam  Constitutional:  Morbidly obese  HENT:  Head: Normocephalic and atraumatic.  Eyes: Conjunctivae are normal. Pupils are equal, round, and reactive to light.  Neck: Normal range of motion.  Cardiovascular: Normal rate.   Pulmonary/Chest: Effort normal and breath sounds normal.  Abdominal: Soft.  Musculoskeletal:       Arms:      Legs: Positive mild tenderness to palpation in lumbar region. Positive left hip pain with internal rotation of left hip. No trochanteric bursa tenderness palpation. Neurovascularly intact distally.  Neurological: She is alert.  Skin: Skin is warm.    [facility] reading (PRIMARY) by  Dr. Alvester Morin Noted degenerative changes on L. spine and left hip x-ray. 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 No acute fracture dislocation preliminarily noted. Will start patient on prednisone Flexeril for acute treatment. Discussed general and musculoskeletal red flags that would warrant reevaluation. Also discussed weight loss. No urinary complaints. Will check urine culture to be complete. Followup as needed.

## 2013-12-19 ENCOUNTER — Encounter: Payer: Self-pay | Admitting: Family

## 2013-12-19 ENCOUNTER — Ambulatory Visit (INDEPENDENT_AMBULATORY_CARE_PROVIDER_SITE_OTHER): Payer: Commercial Indemnity | Admitting: Family

## 2013-12-19 VITALS — BP 143/75 | HR 80 | Temp 98.0°F | Ht 65.0 in | Wt 294.2 lb

## 2013-12-19 DIAGNOSIS — L03317 Cellulitis of buttock: Secondary | ICD-10-CM

## 2013-12-19 DIAGNOSIS — B379 Candidiasis, unspecified: Secondary | ICD-10-CM

## 2013-12-19 DIAGNOSIS — L0231 Cutaneous abscess of buttock: Secondary | ICD-10-CM

## 2013-12-19 MED ORDER — FLUCONAZOLE 150 MG PO TABS: 150.0000 mg | ORAL_TABLET | Freq: Once | ORAL | Status: DC

## 2013-12-19 MED ORDER — DOXYCYCLINE HYCLATE 100 MG PO TABS: 100.0000 mg | ORAL_TABLET | Freq: Two times a day (BID) | ORAL | Status: DC

## 2013-12-19 NOTE — Patient Instructions (Signed)

## 2013-12-19 NOTE — Progress Notes (Signed)
   Subjective:    Patient ID: Tracy MaskerLinda P Glass, female    DOB: 1955-07-05, 58 y.o.   MRN: [id number]  HPI Pt presents to the office for an abscess under her right gluteal fold that she noticed Saturday. Pt states her and her husband tired to "squezze it and a small liquid came out". Pt states it is a constant throbbing pain 8 out 10.    Review of Systems  Constitutional: Negative.   HENT: Negative.   Eyes: Negative.   Respiratory: Negative.  Negative for shortness of breath.   Cardiovascular: Negative.  Negative for palpitations.  Gastrointestinal: Negative.   Endocrine: Negative.   Genitourinary: Negative.   Musculoskeletal: Negative.   Neurological: Negative.  Negative for headaches.  Hematological: Negative.   Psychiatric/Behavioral: Negative.   All other systems reviewed and are negative.      Objective:   Physical Exam  Vitals reviewed. Constitutional: She is oriented to person, place, and time. She appears well-developed and well-nourished. No distress.  Cardiovascular: Normal rate, regular rhythm, normal heart sounds and intact distal pulses.   No murmur heard. Pulmonary/Chest: Effort normal and breath sounds normal. No respiratory distress. She has no wheezes.  Abdominal: Soft. Bowel sounds are normal. She exhibits no distension. There is no tenderness.  Musculoskeletal: Normal range of motion. She exhibits no edema and no tenderness.  Neurological: She is alert and oriented to person, place, and time. She has normal reflexes. No cranial nerve deficit.  Skin: Skin is warm and dry. There is erythema.  Circular erythemas hard area under right gluteal cheek- 5cmX3cm  Psychiatric: She has a normal mood and affect. Her behavior is normal. Judgment and thought content normal.    BP 143/75  Pulse 80  Temp(Src) 98 F (36.7 C) (Oral)  Ht 5\' 5"  (1.651 m)  Wt 294 lb 3.2 oz (133.448 kg)  BMI 48.96 kg/m2       Assessment & Plan:  1. Abscess, gluteal -Do not pick or squeeze  at area -keep area clean and dry -Warm compresses  - doxycycline (VIBRA-TABS) 100 MG tablet; Take 1 tablet (100 mg total) by mouth 2 (two) times daily.  Dispense: 20 tablet; Refill: 0  2. Yeast infection -Keep clean and dry -Cotton underwear - fluconazole (DIFLUCAN) 150 MG tablet; Take 1 tablet (150 mg total) by mouth once.  Dispense: 2 tablet; Refill: 0  Jannifer Rodneyhristy Candida Vetter, FNP

## 2014-03-05 ENCOUNTER — Encounter: Payer: Self-pay | Admitting: Family Medicine

## 2014-03-05 ENCOUNTER — Ambulatory Visit (INDEPENDENT_AMBULATORY_CARE_PROVIDER_SITE_OTHER): Payer: Commercial Indemnity | Admitting: Family Medicine

## 2014-03-05 VITALS — BP 136/68 | HR 84 | Temp 98.2°F | Ht 65.0 in | Wt 288.4 lb

## 2014-03-05 DIAGNOSIS — L03818 Cellulitis of other sites: Secondary | ICD-10-CM

## 2014-03-05 DIAGNOSIS — L0231 Cutaneous abscess of buttock: Secondary | ICD-10-CM

## 2014-03-05 DIAGNOSIS — R21 Rash and other nonspecific skin eruption: Secondary | ICD-10-CM

## 2014-03-05 MED ORDER — METHYLPREDNISOLONE ACETATE 80 MG/ML IJ SUSP
80.0000 mg | Freq: Once | INTRAMUSCULAR | Status: AC
  Administered 2014-03-05: 80 mg via INTRAMUSCULAR

## 2014-03-05 MED ORDER — DOXYCYCLINE HYCLATE 100 MG PO TABS: 100.0000 mg | ORAL_TABLET | Freq: Two times a day (BID) | ORAL | Status: DC

## 2014-03-05 MED ORDER — HYDROXYZINE HCL 25 MG PO TABS: 25.0000 mg | ORAL_TABLET | Freq: Three times a day (TID) | ORAL | Status: DC | PRN

## 2014-03-05 NOTE — Progress Notes (Signed)
   Subjective:    Patient ID: [name], female    DOB: 11/10/1955, 58 y.o.   MRN: [id number]  HPI C/o swelling and rash on left lower extremity for 3 days.  She is having a lot of itching.  Review of Systems  Constitutional: Negative for fever.  HENT: Negative for ear pain.   Eyes: Negative for discharge.  Respiratory: Negative for cough.   Cardiovascular: Negative for chest pain.  Gastrointestinal: Negative for abdominal distention.  Endocrine: Negative for polyuria.  Genitourinary: Negative for difficulty urinating.  Musculoskeletal: Negative for gait problem and neck pain.  Skin: Positive for rash. Negative for color change.  Neurological: Negative for speech difficulty and headaches.  Psychiatric/Behavioral: Negative for agitation.       Objective:    BP 136/68 mmHg  Pulse 84  Temp(Src) 98.2 F (36.8 C) (Oral)  Ht 5\' 5"  (1.651 m)  Wt 288 lb 6.4 oz (130.817 kg)  BMI 47.99 kg/m2 Physical Exam  Constitutional: She is oriented to person, place, and time. She appears well-developed and well-nourished.  HENT:  Head: Normocephalic and atraumatic.  Mouth/Throat: Oropharynx is clear and moist.  Eyes: Pupils are equal, round, and reactive to light.  Neck: Normal range of motion. Neck supple.  Cardiovascular: Normal rate and regular rhythm.   No murmur heard. Pulmonary/Chest: Effort normal and breath sounds normal.  Abdominal: Soft. Bowel sounds are normal. There is no tenderness.  Neurological: She is alert and oriented to person, place, and time.  Skin: Skin is warm and dry. Rash noted. There is erythema.  Left lower extremity with patches of erythema and tenderness and left lower extremity with swelling and negative Homan's  Psychiatric: She has a normal mood and affect.          Assessment & Plan:     ICD-9-CM ICD-10-CM   1. Abscess, gluteal 682.5 L02.31   2. Rash and nonspecific skin eruption 782.1 R21 doxycycline (VIBRA-TABS) 100 MG tablet     hydrOXYzine  (ATARAX/VISTARIL) 25 MG tablet  3. Cellulitis of other specified site 682.8 L03.818 methylPREDNISolone acetate (DEPO-MEDROL) injection 80 mg     Return if symptoms worsen or fail to improve.  Deatra CanterWilliam J Merced Hanners FNP

## 2014-10-06 ENCOUNTER — Ambulatory Visit (INDEPENDENT_AMBULATORY_CARE_PROVIDER_SITE_OTHER): Payer: Commercial Indemnity | Admitting: Physician Assistant

## 2014-10-06 ENCOUNTER — Encounter: Payer: Self-pay | Admitting: Physician Assistant

## 2014-10-06 VITALS — BP 125/73 | HR 76 | Temp 97.5°F | Ht 65.0 in | Wt 250.4 lb

## 2014-10-06 DIAGNOSIS — B373 Candidiasis of vulva and vagina: Secondary | ICD-10-CM

## 2014-10-06 DIAGNOSIS — L089 Local infection of the skin and subcutaneous tissue, unspecified: Secondary | ICD-10-CM

## 2014-10-06 DIAGNOSIS — B3731 Acute candidiasis of vulva and vagina: Secondary | ICD-10-CM

## 2014-10-06 MED ORDER — DOXYCYCLINE HYCLATE 100 MG PO TABS: 100.0000 mg | ORAL_TABLET | Freq: Two times a day (BID) | ORAL | Status: DC

## 2014-10-06 MED ORDER — FLUCONAZOLE 150 MG PO TABS: ORAL_TABLET | ORAL | Status: DC

## 2014-10-06 NOTE — Patient Instructions (Addendum)
Community-Associated MRSA CA-MRSA stands for community-associated methicillin-resistant Staphylococcus aureus. MRSA is a type of bacteria that is resistant to some common antibiotics. It can cause infections in the skin and many other places in the body. Staphylococcus aureus, often called "staph," is a bacteria that normally lives on the skin or in the nose. Staph on the surface of the skin or in the nose does not cause problems. However, if the staph enters the body through a cut, wound, or break in the skin, an infection can happen. Up until recently, infections with the MRSA type of staph mainly occurred in hospitals and other health care settings. There are now increasing problems with MRSA infections in the community as well. Infections with MRSA may be very serious or even life threatening. CA-MRSA is becoming more common. It is known to spread in crowded settings, in jails and prisons, and in situations where there is close skin-to-skin contact, such as during sporting events or in locker rooms. MRSA can be spread through shared items, such as children's toys, razors, towels, or sports equipment.  CAUSES All staph, including MRSA, are normally harmless unless they enter the body through a scratch, cut, or wound, such as with surgery. All staph, including MRSA, can be spread from person-to-person by touching contaminated objects or through direct contact.  MRSA now causes illness in people who have not been in hospitals or other health care facilities. Cases of MRSA diseases in the community have been associated with:  Recent antibiotic use.  Sharing contaminated towels or clothes.  Having active skin diseases.  Participating in contact sports.  Living in crowded settings.  Intravenous (IV) drug use.  Community-associated MRSA infections are usually skin infections, but may cause other severe illnesses.  Staph bacteria are one of the most common causes of skin infection. However, they  are also a common cause of pneumonia, bone or joint infections, and bloodstream infections. DIAGNOSIS Diagnosis of MRSA is done by cultures of fluid samples that may come from:  Swabs taken from cuts or wounds in infected areas.  Nasal swabs.  Saliva or deep cough specimens from the lungs (sputum).  Urine.  Blood. Many people are "colonized" with MRSA but have no signs of infection. This means that people carry the MRSA germ on their skin or in their nose and may never develop MRSA infection.  TREATMENT  Treatment varies and is based on how serious, how deep, or how extensive the infection is. For example:  Some skin infections, such as a small boil or abscess, may be treated by draining yellowish-white fluid (pus) from the site of the infection.  Deeper or more widespread soft tissue infections are usually treated with surgery to drain pus and with antibiotic medicine given by vein or by mouth. This may be recommended even if you are pregnant.  Serious infections may require a hospital stay. If antibiotics are given, they may be needed for several weeks. PREVENTION Because many people are colonized with staph, including MRSA, preventing the spread of the bacteria from person-to-person is most important. The best way to prevent the spread of bacteria and other germs is through proper hand washing or by using alcohol-based hand disinfectants. The following are other ways to help prevent MRSA infection within community settings.   Wash your hands frequently with soap and water for at least 15 seconds. Otherwise, use alcohol-based hand disinfectants when soap and water is not available.  Make sure people who live with you wash their hands often, too.  Do not share personal items. For example, avoid sharing razors and other personal hygiene items, towels, clothing, and athletic equipment.  Wash and dry your clothes and bedding at the warmest temperatures recommended on the labels.  Keep  wounds covered. Pus from infected sores may contain MRSA and other bacteria. Keep cuts and abrasions clean and covered with germ-free (sterile), dry bandages until they are healed.  If you have a wound that appears infected, ask your caregiver if a culture for MRSA and other bacteria should be done.  If you are breastfeeding, talk to your caregiver about MRSA. You may be asked to temporarily stop breastfeeding. HOME CARE INSTRUCTIONS   Take your antibiotics as directed. Finish them even if you start to feel better.  Avoid close contact with those around you as much as possible. Do not use towels, razors, toothbrushes, bedding, or other items that will be used by others.  To fight the infection, follow your caregiver's instructions for wound care. Wash your hands before and after changing your bandages.  If you have an intravascular device, such as a catheter, make sure you know how to care for it.  Be sure to tell any health care providers that you have MRSA so they are aware of your infection. SEEK IMMEDIATE MEDICAL CARE IF:  The infection appears to be getting worse. Signs include:  Increased warmth, redness, or tenderness around the wound site.  A red line that extends from the infection site.  A dark color in the area around the infection.  Wound drainage that is tan, yellow, or green.  A bad smell coming from the wound.  You feel sick to your stomach (nauseous) and throw up (vomit) or cannot keep medicine down.  You have a fever.  Your baby is older than 3 months with a rectal temperature of 102F (38.9C) or higher.  Your baby is 73 months old or younger with a rectal temperature of 100.62F (38C) or higher.  You have difficulty breathing. MAKE SURE YOU:   Understand these instructions.  Will watch your condition.  Will get help right away if you are not doing well or get worse. Document Released: 07/24/2005 Document Revised: 09/04/2013 Document Reviewed:  07/24/2010 [facility] Patient Information 2015 [place], [place]. This information is not intended to replace advice given to you by your health care provider. Make sure you discuss any questions you have with your health care provider.   IF EXTREME PAIN, FEVER OR STREAKING OCCURS OR REDNESS SPREADS, RETURN TO OFFICEBody Mass Index (BMI) This BMI is not intended for use with those under 13 years of age, or pregnant women, or lactating women. To estimate BMI, locate your height, then find your weight in this listing. Your BMI is located to the right of your weight. Height: 58 inches  Weight: 91 lb = BMI 19 (normal)  Weight: 96 lb = BMI 20 (normal)  Weight: 100 lb = BMI 21 (normal)  Weight: 105 lb = BMI 22 (normal)  Weight: 110 lb = BMI 23 (normal)  Weight: 115 lb = BMI 24 (normal)  Weight: 119 lb = BMI 25 (overweight)  Weight: 124 lb = BMI 26 (overweight)  Weight: 129 lb = BMI 27 (overweight)  Weight: 134 lb = BMI 28 (overweight)  Weight: 138 lb = BMI 29 (overweight)  Weight: 143 lb = BMI 30 (obese)  Weight: 148 lb = BMI 31 (obese)  Weight: 153 lb = BMI 32 (obese)  Weight: 158 lb = BMI 33 (obese)  Weight:  162 lb = BMI 34 (obese)  Weight: 167 lb = BMI 35 (obese)  Weight: 172 lb = BMI 36 (obese)  Weight: 177 lb = BMI 37 (obese)  Weight: 181 lb = BMI 38 (obese)  Weight: 186 lb = BMI 39 (obese)  Weight: 191 lb = BMI 40 (extreme obesity)  Weight: 196 lb = BMI 41 (extreme obesity)  Weight: 201 lb = BMI 42 (extreme obesity)  Weight: 205 lb = BMI 43 (extreme obesity)  Weight: 210 lb = BMI 44 (extreme obesity)  Weight: 215 lb = BMI 45 (extreme obesity)  Weight: 220 lb = BMI 46 (extreme obesity)  Weight: 224 lb = BMI 47 (extreme obesity)  Weight: 229 lb = BMI 48 (extreme obesity)  Weight: 234 lb = BMI 49 (extreme obesity)  Weight: 239 lb = BMI 50 (extreme obesity)  Weight: 244 lb = BMI 51 (extreme obesity)  Weight: 248 lb = BMI 52 (extreme  obesity)  Weight: 253 lb = BMI 53 (extreme obesity)  Weight: 258 lb = BMI 54 (extreme obesity) Height: 59 inches  Weight: 94 lb = BMI 19 (normal)  Weight: 99 lb = BMI 20 (normal)  Weight: 104 lb = BMI 21 (normal)  Weight: 109 lb = BMI 22 (normal)  Weight: 114 lb = BMI 23 (normal)  Weight: 119 lb = BMI 24 (normal)  Weight: 124 lb = BMI 25 (overweight)  Weight: 128 lb = BMI 26 (overweight)  Weight: 133 lb = BMI 27 (overweight)  Weight: 138 lb = BMI 28 (overweight)  Weight: 143 lb = BMI 29 (overweight)  Weight: 148 lb = BMI 30 (obese)  Weight: 153 lb = BMI 31 (obese)  Weight: 158 lb = BMI 32 (obese)  Weight: 163 lb = BMI 33 (obese)  Weight: 168 lb = BMI 34 (obese)  Weight: 173 lb = BMI 35 (obese)  Weight: 178 lb = BMI 36 (obese)  Weight: 183 lb = BMI 37 (obese)  Weight: 188 lb = BMI 38 (obese)  Weight: 193 lb = BMI 39 (obese)  Weight: 198 lb = BMI 40 (extreme obesity)  Weight: 203 lb = BMI 41 (extreme obesity)  Weight: 208 lb = BMI 42 (extreme obesity)  Weight: 212 lb = BMI 43 (extreme obesity)  Weight: 217 lb = BMI 44 (extreme obesity)  Weight: 222 lb = BMI 45 (extreme obesity)  Weight: 227 lb = BMI 46 (extreme obesity)  Weight: 232 lb = BMI 47 (extreme obesity)  Weight: 237 lb = BMI 48 (extreme obesity)  Weight: 242 lb = BMI 49 (extreme obesity)  Weight: 247 lb = BMI 50 (extreme obesity)  Weight: 252 lb = BMI 51 (extreme obesity)  Weight: 257 lb = BMI 52 (extreme obesity)  Weight: 262 lb = BMI 53 (extreme obesity)  Weight: 267 lb = BMI 54 (extreme obesity) Height: 60 inches  Weight: 97 lb = BMI 19 (normal)  Weight: 102 lb = BMI 20 (normal)  Weight: 107 lb = BMI 21 (normal)  Weight: 112 lb = BMI 22 (normal)  Weight: 118 lb = BMI 23 (normal)  Weight: 123 lb = BMI 24 (normal)  Weight: 128 lb = BMI 25 (overweight)  Weight: 133 lb = BMI 26 (overweight)  Weight: 138 lb = BMI 27 (overweight)  Weight: 143 lb = BMI 28  (overweight)  Weight: 148 lb = BMI 29 (overweight)  Weight: 153 lb = BMI 30 (obese)  Weight: 158 lb = BMI 31 (obese)  Weight: 163 lb =  BMI 32 (obese)  Weight: 168 lb = BMI 33 (obese)  Weight: 174 lb = BMI 34 (obese)  Weight: 179 lb = BMI 35 (obese)  Weight: 184 lb = BMI 36 (obese)  Weight: 189 lb = BMI 37 (obese)  Weight: 194 lb = BMI 38 (obese)  Weight: 199 lb = BMI 39 (obese)  Weight: 204 lb = BMI 40 (extreme obesity)  Weight: 209 lb = BMI 41 (extreme obesity)  Weight: 215 lb = BMI 42 (extreme obesity)  Weight: 220 lb = BMI 43 (extreme obesity)  Weight: 225 lb = BMI 44 (extreme obesity)  Weight: 230 lb = BMI 45 (extreme obesity)  Weight: 235 lb = BMI 46 (extreme obesity)  Weight: 240 lb = BMI 47 (extreme obesity)  Weight: 245 lb = BMI 48 (extreme obesity)  Weight: 250 lb = BMI 49 (extreme obesity)  Weight: 255 lb = BMI 50 (extreme obesity)  Weight: 261 lb = BMI 51 (extreme obesity)  Weight: 266 lb = BMI 52 (extreme obesity)  Weight: 271 lb = BMI 53 (extreme obesity)  Weight: 276 lb = BMI 54 (extreme obesity) Height: 61 inches  Weight: 100 lb = BMI 19 (normal)  Weight: 106 lb = BMI 20 (normal)  Weight: 111 lb = BMI 21 (normal)  Weight: 116 lb = BMI 22 (normal)  Weight: 122 lb = BMI 23 (normal)  Weight: 127 lb = BMI 24 (normal)  Weight: 132 lb = BMI 25 (overweight)  Weight: 137 lb = BMI 26 (overweight)  Weight: 143 lb = BMI 27 (overweight)  Weight: 148 lb = BMI 28 (overweight)  Weight: 153 lb = BMI 29 (overweight)  Weight: 158 lb = BMI 30 (obese)  Weight: 164 lb = BMI 31 (obese)  Weight: 169 lb = BMI 32 (obese)  Weight: 174 lb = BMI 33 (obese)  Weight: 180 lb = BMI 34 (obese)  Weight: 185 lb = BMI 35 (obese)  Weight: 190 lb = BMI 36 (obese)  Weight: 195 lb = BMI 37 (obese)  Weight: 201 lb = BMI 38 (obese)  Weight: 206 lb = BMI 39 (obese)  Weight: 211 lb = BMI 40 (extreme obesity)  Weight: 217 lb = BMI 41 (extreme  obesity)  Weight: 222 lb = BMI 42 (extreme obesity)  Weight: 227 lb = BMI 43 (extreme obesity)  Weight: 232 lb = BMI 44 (extreme obesity)  Weight: 238 lb = BMI 45 (extreme obesity)  Weight: 243 lb = BMI 46 (extreme obesity)  Weight: 248 lb = BMI 47 (extreme obesity)  Weight: 254 lb = BMI 48 (extreme obesity)  Weight: 259 lb = BMI 49 (extreme obesity)  Weight: 264 lb = BMI 50 (extreme obesity)  Weight: 269 lb = BMI 51 (extreme obesity)  Weight: 275 lb = BMI 52 (extreme obesity)  Weight: 280 lb = BMI 53 (extreme obesity)  Weight: 285 lb = BMI 54 (extreme obesity) Height: 62 inches  Weight: 104 lb = BMI 19 (normal)  Weight: 109 lb = BMI 20 (normal)  Weight: 115 lb = BMI 21 (normal)  Weight: 120 lb = BMI 22 (normal)  Weight: 126 lb = BMI 23 (normal)  Weight: 131 lb = BMI 24 (normal)  Weight: 136 lb = BMI 25 (overweight)  Weight: 142 lb = BMI 26 (overweight)  Weight: 147 lb = BMI 27 (overweight)  Weight: 153 lb = BMI 28 (overweight)  Weight: 158 lb = BMI 29 (overweight)  Weight: 164 lb = BMI 30 (obese)  Weight: 169 lb = BMI 31 (obese)  Weight: 175 lb = BMI 32 (obese)  Weight: 180 lb = BMI 33 (obese)  Weight: 186 lb = BMI 34 (obese)  Weight: 191 lb = BMI 35 (obese)  Weight: 196 lb = BMI 36 (obese)  Weight: 202 lb = BMI 37 (obese)  Weight: 207 lb = BMI 38 (obese)  Weight: 213 lb = BMI 39 (obese)  Weight: 218 lb = BMI 40 (extreme obesity)  Weight: 224 lb = BMI 41 (extreme obesity)  Weight: 229 lb = BMI 42 (extreme obesity)  Weight: 235 lb = BMI 43 (extreme obesity)  Weight: 240 lb = BMI 44 (extreme obesity)  Weight: 246 lb = BMI 45 (extreme obesity)  Weight: 251 lb = BMI 46 (extreme obesity)  Weight: 256 lb = BMI 47 (extreme obesity)  Weight: 262 lb = BMI 48 (extreme obesity)  Weight: 267 lb = BMI 49 (extreme obesity)  Weight: 273 lb = BMI 50 (extreme obesity)  Weight: 278 lb = BMI 51 (extreme obesity)  Weight: 284 lb = BMI 52  (extreme obesity)  Weight: 289 lb = BMI 53 (extreme obesity)  Weight: 295 lb = BMI 54 (extreme obesity) Height: 63 inches  Weight: 107 lb = BMI 19 (normal)  Weight: 113 lb = BMI 20 (normal)  Weight: 118 lb = BMI 21 (normal)  Weight: 124 lb = BMI 22 (normal)  Weight: 130 lb = BMI 23 (normal)  Weight: 135 lb = BMI 24 (normal)  Weight: 141 lb = BMI 25 (overweight)  Weight: 146 lb = BMI 26 (overweight)  Weight: 152 lb = BMI 27 (overweight)  Weight: 158 lb = BMI 28 (overweight)  Weight: 163 lb = BMI 29 (overweight)  Weight: 169 lb = BMI 30 (obese)  Weight: 175 lb = BMI 31 (obese)  Weight: 180 lb = BMI 32 (obese)  Weight: 186 lb = BMI 33 (obese)  Weight: 191 lb = BMI 34 (obese)  Weight: 197 lb = BMI 35 (obese)  Weight: 203 lb = BMI 36 (obese)  Weight: 208 lb = BMI 37 (obese)  Weight: 214 lb = BMI 38 (obese)  Weight: 220 lb = BMI 39 (obese)  Weight: 225 lb = BMI 40 (extreme obesity)  Weight: 231 lb = BMI 41 (extreme obesity)  Weight: 237 lb = BMI 42 (extreme obesity)  Weight: 242 lb = BMI 43 (extreme obesity)  Weight: 248 lb = BMI 44 (extreme obesity)  Weight: 254 lb = BMI 45 (extreme obesity)  Weight: 259 lb = BMI 46 (extreme obesity)  Weight: 265 lb = BMI 47 (extreme obesity)  Weight: 270 lb = BMI 48 (extreme obesity)  Weight: 278 lb = BMI 49 (extreme obesity)  Weight: 282 lb = BMI 50 (extreme obesity)  Weight: 287 lb = BMI 51 (extreme obesity)  Weight: 293 lb = BMI 52 (extreme obesity)  Weight: 299 lb = BMI 53 (extreme obesity)  Weight: 304 lb = BMI 54 (extreme obesity) Height: 64 inches  Weight: 110 lb = BMI 19 (normal)  Weight: 116 lb = BMI 20 (normal)  Weight: 122 lb = BMI 21 (normal)  Weight: 128 lb = BMI 22 (normal)  Weight: 134 lb = BMI 23 (normal)  Weight: 140 lb = BMI 24 (normal)  Weight: 145 lb = BMI 25 (overweight)  Weight: 151 lb = BMI 26 (overweight)  Weight: 157 lb = BMI 27 (overweight)  Weight: 163 lb = BMI 28  (overweight)  Weight: 169  lb = BMI 29 (overweight)  Weight: 174 lb = BMI 30 (obese)  Weight: 180 lb = BMI 31 (obese)  Weight: 186 lb = BMI 32 (obese)  Weight: 192 lb = BMI 33 (obese)  Weight: 197 lb = BMI 34 (obese)  Weight: 204 lb = BMI 35 (obese)  Weight: 209 lb = BMI 36 (obese)  Weight: 215 lb = BMI 37 (obese)  Weight: 221 lb = BMI 38 (obese)  Weight: 227 lb = BMI 39 (obese)  Weight: 232 lb = BMI 40 (extreme obesity)  Weight: 238 lb = BMI 41 (extreme obesity)  Weight: 244 lb = BMI 42 (extreme obesity)  Weight: 250 lb = BMI 43 (extreme obesity)  Weight: 256 lb = BMI 44 (extreme obesity)  Weight: 262 lb = BMI 45 (extreme obesity)  Weight: 267 lb = BMI 46 (extreme obesity)  Weight: 273 lb = BMI 47 (extreme obesity)  Weight: 279 lb = BMI 48 (extreme obesity)  Weight: 285 lb = BMI 49 (extreme obesity)  Weight: 291 lb = BMI 50 (extreme obesity)  Weight: 296 lb = BMI 51 (extreme obesity)  Weight: 302 lb = BMI 52 (extreme obesity)  Weight: 308 lb = BMI 53 (extreme obesity)  Weight: 314 lb = BMI 54 (extreme obesity) Height: 65 inches  Weight: 114 lb = BMI 19 (normal)  Weight: 120 lb = BMI 20 (normal)  Weight: 126 lb = BMI 21 (normal)  Weight: 132 lb = BMI 22 (normal)  Weight: 138 lb = BMI 23 (normal)  Weight: 144 lb = BMI 24 (normal)  Weight: 150 lb = BMI 25 (overweight)  Weight: 156 lb = BMI 26 (overweight)  Weight: 162 lb = BMI 27 (overweight)  Weight: 168 lb = BMI 28 (overweight)  Weight: 174 lb = BMI 29 (overweight)  Weight: 180 lb = BMI 30 (obese)  Weight: 186 lb = BMI 31 (obese)  Weight: 192 lb = BMI 32 (obese)  Weight: 198 lb = BMI 33 (obese)  Weight: 204 lb = BMI 34 (obese)  Weight: 210 lb = BMI 35 (obese)  Weight: 216 lb = BMI 36 (obese)  Weight: 222 lb = BMI 37 (obese)  Weight: 228 lb = BMI 38 (obese)  Weight: 234 lb = BMI 39 (obese)  Weight: 240 lb = BMI 40 (extreme obesity)  Weight: 246 lb = BMI 41 (extreme  obesity)  Weight: 252 lb = BMI 42 (extreme obesity)  Weight: 258 lb = BMI 43 (extreme obesity)  Weight: 264 lb = BMI 44 (extreme obesity)  Weight: 270 lb = BMI 45 (extreme obesity)  Weight: 276 lb = BMI 46 (extreme obesity)  Weight: 282 lb = BMI 47 (extreme obesity)  Weight: 288 lb = BMI 48 (extreme obesity)  Weight: 294 lb = BMI 49 (extreme obesity)  Weight: 300 lb = BMI 50 (extreme obesity)  Weight: 306 lb = BMI 51 (extreme obesity)  Weight: 312 lb = BMI 52 (extreme obesity)  Weight: 318 lb = BMI 53 (extreme obesity)  Weight: 324 lb = BMI 54 (extreme obesity) Height: 66 inches  Weight: 118 lb = BMI 19 (normal)  Weight: 124 lb = BMI 20 (normal)  Weight: 130 lb = BMI 21 (normal)  Weight: 136 lb = BMI 22 (normal)  Weight: 142 lb = BMI 23 (normal)  Weight: 148 lb = BMI 24 (normal)  Weight: 155 lb = BMI 25 (overweight)  Weight: 161 lb = BMI 26 (overweight)  Weight: 167 lb = BMI  27 (overweight)  Weight: 173 lb = BMI 28 (overweight)  Weight: 179 lb = BMI 29 (overweight)  Weight: 186 lb = BMI 30 (obese)  Weight: 192 lb = BMI 31 (obese)  Weight: 198 lb = BMI 32 (obese)  Weight: 204 lb = BMI 33 (obese)  Weight: 210 lb = BMI 34 (obese)  Weight: 216 lb = BMI 35 (obese)  Weight: 223 lb = BMI 36 (obese)  Weight: 229 lb = BMI 37 (obese)  Weight: 235 lb = BMI 38 (obese)  Weight: 241 lb = BMI 39 (obese)  Weight: 247 lb = BMI 40 (extreme obesity)  Weight: 253 lb = BMI 41 (extreme obesity)  Weight: 260 lb = BMI 42 (extreme obesity)  Weight: 266 lb = BMI 43 (extreme obesity)  Weight: 272 lb = BMI 44 (extreme obesity)  Weight: 278 lb = BMI 45 (extreme obesity)  Weight: 284 lb = BMI 46 (extreme obesity)  Weight: 291 lb = BMI 47 (extreme obesity)  Weight: 297 lb = BMI 48 (extreme obesity)  Weight: 303 lb = BMI 49 (extreme obesity)  Weight: 309 lb = BMI 50 (extreme obesity)  Weight: 315 lb = BMI 51 (extreme obesity)  Weight: 322 lb = BMI 52  (extreme obesity)  Weight: 328 lb = BMI 53 (extreme obesity)  Weight: 334 lb = BMI 54 (extreme obesity) Height: 67 inches  Weight: 121 lb = BMI 19 (normal)  Weight: 127 lb = BMI 20 (normal)  Weight: 134 lb = BMI 21 (normal)  Weight: 140 lb = BMI 22 (normal)  Weight: 146 lb = BMI 23 (normal)  Weight: 153 lb = BMI 24 (normal)  Weight: 159 lb = BMI 25 (overweight)  Weight: 166 lb = BMI 26 (overweight)  Weight: 172 lb = BMI 27 (overweight)  Weight: 178 lb = BMI 28 (overweight)  Weight: 185 lb = BMI 29 (overweight)  Weight: 191 lb = BMI 30 (obese)  Weight: 198 lb = BMI 31 (obese)  Weight: 204 lb = BMI 32 (obese)  Weight: 211 lb = BMI 33 (obese)  Weight: 217 lb = BMI 34 (obese)  Weight: 223 lb = BMI 35 (obese)  Weight: 230 lb = BMI 36 (obese)  Weight: 236 lb = BMI 37 (obese)  Weight: 242 lb = BMI 38 (obese)  Weight: 249 lb = BMI 39 (obese)  Weight: 255 lb = BMI 40 (extreme obesity)  Weight: 261 lb = BMI 41 (extreme obesity)  Weight: 268 lb = BMI 42 (extreme obesity)  Weight: 274 lb = BMI 43 (extreme obesity)  Weight: 280 lb = BMI 44 (extreme obesity)  Weight: 287 lb = BMI 45 (extreme obesity)  Weight: 293 lb = BMI 46 (extreme obesity)  Weight: 299 lb = BMI 47 (extreme obesity)  Weight: 306 lb = BMI 48 (extreme obesity)  Weight: 312 lb = BMI 49 (extreme obesity)  Weight: 319 lb = BMI 50 (extreme obesity)  Weight: 325 lb = BMI 51 (extreme obesity)  Weight: 331 lb = BMI 52 (extreme obesity)  Weight: 338 lb = BMI 53 (extreme obesity)  Weight: 344 lb = BMI 54 (extreme obesity) Height: 68 inches  Weight: 125 lb = BMI 19 (normal)  Weight: 131 lb = BMI 20 (normal)  Weight: 138 lb = BMI 21 (normal)  Weight: 144 lb = BMI 22 (normal)  Weight: 151 lb = BMI 23 (normal)  Weight: 158 lb = BMI 24 (normal)  Weight: 164 lb = BMI 25 (overweight)  Weight: 171 lb = BMI 26 (overweight)  Weight: 177 lb = BMI 27 (overweight)  Weight: 184 lb = BMI 28  (overweight)  Weight: 190 lb = BMI 29 (overweight)  Weight: 197 lb = BMI 30 (obese)  Weight: 203 lb = BMI 31 (obese)  Weight: 210 lb = BMI 32 (obese)  Weight: 216 lb = BMI 33 (obese)  Weight: 223 lb = BMI 34 (obese)  Weight: 230 lb = BMI 35 (obese)  Weight: 236 lb = BMI 36 (obese)  Weight: 243 lb = BMI 37 (obese)  Weight: 249 lb = BMI 38 (obese)  Weight: 256 lb = BMI 39 (obese)  Weight: 262 lb = BMI 40 (extreme obesity)  Weight: 269 lb = BMI 41 (extreme obesity)  Weight: 276 lb = BMI 42 (extreme obesity)  Weight: 282 lb = BMI 43 (extreme obesity)  Weight: 289 lb = BMI 44 (extreme obesity)  Weight: 295 lb = BMI 45 (extreme obesity)  Weight: 302 lb = BMI 46 (extreme obesity)  Weight: 308 lb = BMI 47 (extreme obesity)  Weight: 315 lb = BMI 48 (extreme obesity)  Weight: 322 lb = BMI 49 (extreme obesity)  Weight: 328 lb = BMI 50 (extreme obesity)  Weight: 335 lb = BMI 51 (extreme obesity)  Weight: 341 lb = BMI 52 (extreme obesity)  Weight: 348 lb = BMI 53 (extreme obesity)  Weight: 354 lb = BMI 54 (extreme obesity) Height: 69 inches  Weight: 128 lb = BMI 19 (normal)  Weight: 135 lb = BMI 20 (normal)  Weight: 142 lb = BMI 21 (normal)  Weight: 149 lb = BMI 22 (normal)  Weight: 155 lb = BMI 23 (normal)  Weight: 162 lb = BMI 24 (normal)  Weight: 169 lb = BMI 25 (overweight)  Weight: 176 lb = BMI 26 (overweight)  Weight: 182 lb = BMI 27 (overweight)  Weight: 189 lb = BMI 28 (overweight)  Weight: 196 lb = BMI 29 (overweight)  Weight: 203 lb = BMI 30 (obese)  Weight: 209 lb = BMI 31 (obese)  Weight: 216 lb = BMI 32 (obese)  Weight: 223 lb = BMI 33 (obese)  Weight: 230 lb = BMI 34 (obese)  Weight: 236 lb = BMI 35 (obese)  Weight: 243 lb = BMI 36 (obese)  Weight: 250 lb = BMI 37 (obese)  Weight: 257 lb = BMI 38 (obese)  Weight: 263 lb = BMI 39 (obese)  Weight: 270 lb = BMI 40 (extreme obesity)  Weight: 277 lb = BMI 41 (extreme  obesity)  Weight: 284 lb = BMI 42 (extreme obesity)  Weight: 291 lb = BMI 43 (extreme obesity)  Weight: 297 lb = BMI 44 (extreme obesity)  Weight: 304 lb = BMI 45 (extreme obesity)  Weight: 311 lb = BMI 46 (extreme obesity)  Weight: 318 lb = BMI 47 (extreme obesity)  Weight: 324 lb = BMI 48 (extreme obesity)  Weight: 331 lb = BMI 49 (extreme obesity)  Weight: 338 lb = BMI 50 (extreme obesity)  Weight: 345 lb = BMI 51 (extreme obesity)  Weight: 351 lb = BMI 52 (extreme obesity)  Weight: 358 lb = BMI 53 (extreme obesity)  Weight: 365 lb = BMI 54 (extreme obesity) Height: 70 inches  Weight: 132 lb = BMI 19 (normal)  Weight: 139 lb = BMI 20 (normal)  Weight: 146 lb = BMI 21 (normal)  Weight: 153 lb = BMI 22 (normal)  Weight: 160 lb = BMI 23 (normal)  Weight: 167  lb = BMI 24 (normal)  Weight: 174 lb = BMI 25 (overweight)  Weight: 181 lb = BMI 26 (overweight)  Weight: 188 lb = BMI 27 (overweight)  Weight: 195 lb = BMI 28 (overweight)  Weight: 202 lb = BMI 29 (overweight)  Weight: 209 lb = BMI 30 (obese)  Weight: 216 lb = BMI 31 (obese)  Weight: 222 lb = BMI 32 (obese)  Weight: 229 lb = BMI 33 (obese)  Weight: 236 lb = BMI 34 (obese)  Weight: 243 lb = BMI 35 (obese)  Weight: 250 lb = BMI 36 (obese)  Weight: 257 lb = BMI 37 (obese)  Weight: 264 lb = BMI 38 (obese)  Weight: 271 lb = BMI 39 (obese)  Weight: 278 lb = BMI 40 (extreme obesity)  Weight: 285 lb = BMI 41 (extreme obesity)  Weight: 292 lb = BMI 42 (extreme obesity)  Weight: 299 lb = BMI 43 (extreme obesity)  Weight: 306 lb = BMI 44 (extreme obesity)  Weight: 313 lb = BMI 45 (extreme obesity)  Weight: 320 lb = BMI 46 (extreme obesity)  Weight: 327 lb = BMI 47 (extreme obesity)  Weight: 334 lb = BMI 48 (extreme obesity)  Weight: 341 lb = BMI 49 (extreme obesity)  Weight: 348 lb = BMI 50 (extreme obesity)  Weight: 355 lb = BMI 51 (extreme obesity)  Weight: 362 lb = BMI 52  (extreme obesity)  Weight: 369 lb = BMI 53 (extreme obesity)  Weight: 376 lb = BMI 54 (extreme obesity) Height: 71 inches  Weight: 136 lb = BMI 19 (normal)  Weight: 143 lb = BMI 20 (normal)  Weight: 150 lb = BMI 21 (normal)  Weight: 157 lb = BMI 22 (normal)  Weight: 165 lb = BMI 23 (normal)  Weight: 172 lb = BMI 24 (normal)  Weight: 179 lb = BMI 25 (overweight)  Weight: 186 lb = BMI 26 (overweight)  Weight: 193 lb = BMI 27 (overweight)  Weight: 200 lb = BMI 28 (overweight)  Weight: 208 lb = BMI 29 (overweight)  Weight: 215 lb = BMI 30 (obese)  Weight: 222 lb = BMI 31 (obese)  Weight: 229 lb = BMI 32 (obese)  Weight: 236 lb = BMI 33 (obese)  Weight: 243 lb = BMI 34 (obese)  Weight: 250 lb = BMI 35 (obese)  Weight: 257 lb = BMI 36 (obese)  Weight: 265 lb = BMI 37 (obese)  Weight: 272 lb = BMI 38 (obese)  Weight: 279 lb = BMI 39 (obese)  Weight: 286 lb = BMI 40 (extreme obesity)  Weight: 293 lb = BMI 41 (extreme obesity)  Weight: 301 lb = BMI 42 (extreme obesity)  Weight: 308 lb = BMI 43 (extreme obesity)  Weight: 315 lb = BMI 44 (extreme obesity)  Weight: 322 lb = BMI 45 (extreme obesity)  Weight: 329 lb = BMI 46 (extreme obesity)  Weight: 338 lb = BMI 47 (extreme obesity)  Weight: 343 lb = BMI 48 (extreme obesity)  Weight: 351 lb = BMI 49 (extreme obesity)  Weight: 358 lb = BMI 50 (extreme obesity)  Weight: 365 lb = BMI 51 (extreme obesity)  Weight: 372 lb = BMI 52 (extreme obesity)  Weight: 379 lb = BMI 53 (extreme obesity)  Weight: 386 lb = BMI 54 (extreme obesity) Height: 72 inches  Weight: 140 lb = BMI 19 (normal)  Weight: 147 lb = BMI 20 (normal)  Weight: 154 lb = BMI 21 (normal)  Weight: 162 lb = BMI  22 (normal)  Weight: 169 lb = BMI 23 (normal)  Weight: 177 lb = BMI 24 (normal)  Weight: 184 lb = BMI 25 (overweight)  Weight: 191 lb = BMI 26 (overweight)  Weight: 199 lb = BMI 27 (overweight)  Weight: 206 lb = BMI 28  (overweight)  Weight: 213 lb = BMI 29 (overweight)  Weight: 221 lb = BMI 30 (obese)  Weight: 228 lb = BMI 31 (obese)  Weight: 235 lb = BMI 32 (obese)  Weight: 242 lb = BMI 33 (obese)  Weight: 250 lb = BMI 34 (obese)  Weight: 258 lb = BMI 35 (obese)  Weight: 265 lb = BMI 36 (obese)  Weight: 272 lb = BMI 37 (obese)  Weight: 279 lb = BMI 38 (obese)  Weight: 287 lb = BMI 39 (obese)  Weight: 294 lb = BMI 40 (extreme obesity)  Weight: 302 lb = BMI 41 (extreme obesity)  Weight: 309 lb = BMI 42 (extreme obesity)  Weight: 316 lb = BMI 43 (extreme obesity)  Weight: 324 lb = BMI 44 (extreme obesity)  Weight: 331 lb = BMI 45 (extreme obesity)  Weight: 338 lb = BMI 46 (extreme obesity)  Weight: 346 lb = BMI 47 (extreme obesity)  Weight: 353 lb = BMI 48 (extreme obesity)  Weight: 361 lb = BMI 49 (extreme obesity)  Weight: 368 lb = BMI 50 (extreme obesity)  Weight: 375 lb = BMI 51 (extreme obesity)  Weight: 383 lb = BMI 52 (extreme obesity)  Weight: 390 lb = BMI 53 (extreme obesity)  Weight: 397 lb = BMI 54 (extreme obesity) Height: 73 inches  Weight: 144 lb = BMI 19 (normal)  Weight: 151 lb = BMI 20 (normal)  Weight: 159 lb = BMI 21 (normal)  Weight: 166 lb = BMI 22 (normal)  Weight: 174 lb = BMI 23 (normal)  Weight: 182 lb = BMI 24 (normal)  Weight: 189 lb = BMI 25 (overweight)  Weight: 197 lb = BMI 26 (overweight)  Weight: 204 lb = BMI 27 (overweight)  Weight: 212 lb = BMI 28 (overweight)  Weight: 219 lb = BMI 29 (overweight)  Weight: 227 lb = BMI 30 (obese)  Weight: 235 lb = BMI 31 (obese)  Weight: 242 lb = BMI 32 (obese)  Weight: 250 lb = BMI 33 (obese)  Weight: 257 lb = BMI 34 (obese)  Weight: 265 lb = BMI 35 (obese)  Weight: 272 lb = BMI 36 (obese)  Weight: 280 lb = BMI 37 (obese)  Weight: 288 lb = BMI 38 (obese)  Weight: 295 lb = BMI 39 (obese)  Weight: 302 lb = BMI 40 (extreme obesity)  Weight: 310 lb = BMI 41 (extreme  obesity)  Weight: 318 lb = BMI 42 (extreme obesity)  Weight: 325 lb = BMI 43 (extreme obesity)  Weight: 333 lb = BMI 44 (extreme obesity)  Weight: 340 lb = BMI 45 (extreme obesity)  Weight: 348 lb = BMI 46 (extreme obesity)  Weight: 355 lb = BMI 47 (extreme obesity)  Weight: 363 lb = BMI 48 (extreme obesity)  Weight: 371 lb = BMI 49 (extreme obesity)  Weight: 378 lb = BMI 50 (extreme obesity)  Weight: 386 lb = BMI 51 (extreme obesity)  Weight: 393 lb = BMI 52 (extreme obesity)  Weight: 401 lb = BMI 53 (extreme obesity)  Weight: 408 lb = BMI 54 (extreme obesity) Height: 74 inches  Weight: 148 lb = BMI 19 (normal)  Weight: 155 lb = BMI 20 (normal)  Weight: 163 lb = BMI 21 (normal)  Weight: 171 lb = BMI 22 (normal)  Weight: 179 lb = BMI 23 (normal)  Weight: 186 lb = BMI 24 (normal)  Weight: 194 lb = BMI 25 (overweight)  Weight: 202 lb = BMI 26 (overweight)  Weight: 210 lb = BMI 27 (overweight)  Weight: 218 lb = BMI 28 (overweight)  Weight: 225 lb = BMI 29 (overweight)  Weight: 233 lb = BMI 30 (obese)  Weight: 241 lb = BMI 31 (obese)  Weight: 249 lb = BMI 32 (obese)  Weight: 256 lb = BMI 33 (obese)  Weight: 264 lb = BMI 34 (obese)  Weight: 272 lb = BMI 35 (obese)  Weight: 280 lb = BMI 36 (obese)  Weight: 287 lb = BMI 37 (obese)  Weight: 295 lb = BMI 38 (obese)  Weight: 303 lb = BMI 39 (obese)  Weight: 311 lb = BMI 40 (extreme obesity)  Weight: 319 lb = BMI 41 (extreme obesity)  Weight: 326 lb = BMI 42 (extreme obesity)  Weight: 334 lb = BMI 43 (extreme obesity)  Weight: 342 lb = BMI 44 (extreme obesity)  Weight: 350 lb = BMI 45 (extreme obesity)  Weight: 358 lb = BMI 46 (extreme obesity)  Weight: 365 lb = BMI 47 (extreme obesity)  Weight: 373 lb = BMI 48 (extreme obesity)  Weight: 381 lb = BMI 49 (extreme obesity)  Weight: 389 lb = BMI 50 (extreme obesity)  Weight: 396 lb = BMI 51 (extreme obesity)  Weight: 404 lb = BMI 52  (extreme obesity)  Weight: 412 lb = BMI 53 (extreme obesity)  Weight: 420 lb = BMI 54 (extreme obesity) Height: 75 inches  Weight: 152 lb = BMI 19 (normal)  Weight: 160 lb = BMI 20 (normal)  Weight: 168 lb = BMI 21 (normal)  Weight: 176 lb = BMI 22 (normal)  Weight: 184 lb = BMI 23 (normal)  Weight: 192 lb = BMI 24 (normal)  Weight: 200 lb = BMI 25 (overweight)  Weight: 208 lb = BMI 26 (overweight)  Weight: 216 lb = BMI 27 (overweight)  Weight: 224 lb = BMI 28 (overweight)  Weight: 232 lb = BMI 29 (overweight)  Weight: 240 lb = BMI 30 (obese)  Weight: 248 lb = BMI 31 (obese)  Weight: 256 lb = BMI 32 (obese)  Weight: 264 lb = BMI 33 (obese)  Weight: 272 lb = BMI 34 (obese)  Weight: 279 lb = BMI 35 (obese)  Weight: 287 lb = BMI 36 (obese)  Weight: 295 lb = BMI 37 (obese)  Weight: 303 lb = BMI 38 (obese)  Weight: 311 lb = BMI 39 (obese)  Weight: 319 lb = BMI 40 (extreme obesity)  Weight: 327 lb = BMI 41 (extreme obesity)  Weight: 335 lb = BMI 42 (extreme obesity)  Weight: 343 lb = BMI 43 (extreme obesity)  Weight: 351 lb = BMI 44 (extreme obesity)  Weight: 359 lb = BMI 45 (extreme obesity)  Weight: 367 lb = BMI 46 (extreme obesity)  Weight: 375 lb = BMI 47 (extreme obesity)  Weight: 383 lb = BMI 48 (extreme obesity)  Weight: 391 lb = BMI 49 (extreme obesity)  Weight: 399 lb = BMI 50 (extreme obesity)  Weight: 407 lb = BMI 51 (extreme obesity)  Weight: 415 lb = BMI 52 (extreme obesity)  Weight: 423 lb = BMI 53 (extreme obesity)  Weight: 431 lb = BMI 54 (extreme obesity) Height: 76 inches  Weight: 156 lb =  BMI 19 (normal)  Weight: 164 lb = BMI 20 (normal)  Weight: 172 lb = BMI 21 (normal)  Weight: 180 lb = BMI 22 (normal)  Weight: 189 lb = BMI 23 (normal)  Weight: 197 lb = BMI 24 (normal)  Weight: 205 lb = BMI 25 (overweight)  Weight: 213 lb = BMI 26 (overweight)  Weight: 221 lb = BMI 27 (overweight)  Weight: 230 lb = BMI 28  (overweight)  Weight: 238 lb = BMI 29 (overweight)  Weight: 246 lb = BMI 30 (obese)  Weight: 254 lb = BMI 31 (obese)  Weight: 263 lb = BMI 32 (obese)  Weight: 271 lb = BMI 33 (obese)  Weight: 279 lb = BMI 34 (obese)  Weight: 287 lb = BMI 35 (obese)  Weight: 295 lb = BMI 36 (obese)  Weight: 304 lb = BMI 37 (obese)  Weight: 312 lb = BMI 38 (obese)  Weight: 320 lb = BMI 39 (obese)  Weight: 328 lb = BMI 40 (extreme obesity)  Weight: 336 lb = BMI 41 (extreme obesity)  Weight: 344 lb = BMI 42 (extreme obesity)  Weight: 353 lb = BMI 43 (extreme obesity)  Weight: 361 lb = BMI 44 (extreme obesity)  Weight: 369 lb = BMI 45 (extreme obesity)  Weight: 377 lb = BMI 46 (extreme obesity)  Weight: 385 lb = BMI 47 (extreme obesity)  Weight: 394 lb = BMI 48 (extreme obesity)  Weight: 402 lb = BMI 49 (extreme obesity)  Weight: 410 lb = BMI 50 (extreme obesity)  Weight: 418 lb = BMI 51 (extreme obesity)  Weight: 426 lb = BMI 52 (extreme obesity)  Weight: 435 lb = BMI 53 (extreme obesity)  Weight: 443 lb = BMI 54 (extreme obesity) Source: Adapted from Clinical Guidelines on the Identification, Evaluation, and Treatment of Overweight and Obesity in Adults: The Evidence Report. HEALTH RISK CLASSIFICATION ACCORDING TO BODY MASS INDEX (BMI) Classification: Underweight.  BMI Category:  less than 18.5  Risk of developing health problems: Increased. Classification: Normal Weight.  BMI Category: 18.5 to 24.9  Risk of developing health problems: Least. Classification: Overweight.  BMI Category: 25.0 to 29.9  Risk of developing health problems: Increased. Classification: Obese class.  BMI Category:  30.0 to 34.9  Risk of developing health problems: High. Classification: Obese class II.  BMI Category:  35.0 to 39.9  Risk of developing health problems: Very high. Classification: Obese class III.  BMI Category: 40.0 or more  Risk of developing health problems:  Extremely high. Note: For persons 2 years and older the 'normal' range may begin slightly above BMI 18.5 and extend into the 'overweight' range.   To clarify risk for each individual, other factors also need to be considered, such as:  Lifestyle habits.  Fitness level.  Presence or absence of other health risk conditions.  The classification system may underestimate or overestimate health risks in certain adults, such as:  Highly muscular adults. Very muscular adults, such as athletes, may have a low percentage of body fat but a large amount of muscle tissue. This can result in a BMI in the overweight range that may over estimate the risk of developing health problems.  Adults who naturally have a very lean body build.  Young adults who have not reached full growth.  Adults over 44 years of age. For adults over age 78, more research is needed to determine if the cut-off points for the 'normal weight' range differ in any way from those for younger adults.  It is also important to note that BMI is only one part of a health risk assessment. To further clarify risk, other factors need to be considered as well.  Age, inherited traits, presence or absence of other conditions such as diabetes, high blood lipids, hypertension, and high blood glucose levels also influence the development of diseases associated with overweight. Risk factors such as poor eating habits, physical inactivity, and tobacco use can play a role in the development of diseases associated with both overweight and underweight. Consult a caregiver for a more complete assessment of your weight as it relates to health risk. It is important to discuss with your caregiver what BMI means for you as an individual. Maintaining a 'normal weight' is one element of good health. However, unhealthy eating habits, low levels of physical activity and tobacco use will increase the risk of health problems even for those within the normal weight  range.  Being overweight indicates some risk to health. But research suggests that regular physical activity can decrease the risk of several health problems. Equally, a nutritious diet has been shown to decrease some of the risks associated with overweight. It is important to emphasize that a weight classification system is but one tool to assess health risks in individuals.  Document Released: 12/31/2003 Document Revised: 07/13/2011 Document Reviewed: 01/28/2005 [facility] Patient Information 2015 [place], [place]. This information is not intended to replace advice given to you by your health care provider. Make sure you discuss any questions you have with your health care provider.

## 2014-10-06 NOTE — Progress Notes (Signed)
   Subjective:    Patient ID: [name], female    DOB: 13-May-1955, 59 y.o.   MRN: [id number]  [facility] with c/o boil on abdomen x 2 days. She has been cleaning with peroxide and alcohol and using neosporin with no relief. She has had similar lesions in the past that were treated with antibiotics.     Review of Systems  Skin: Positive for color change and wound (on lower abdomen, painful to touch).       Objective:   Physical Exam  Constitutional: She is oriented to person, place, and time. She appears well-developed and well-nourished. No distress.  Neurological: She is alert and oriented to person, place, and time.  Skin: Skin is warm. She is not diaphoretic. There is erythema.  Localized area of erythema with edema and irregular borders Central erosion  Psychiatric: She has a normal mood and affect. Her behavior is normal. Judgment and thought content normal.  Nursing note and vitals reviewed.         Assessment & Plan:  1. Skin infection  - doxycycline (VIBRA-TABS) 100 MG tablet; Take 1 tablet (100 mg total) by mouth 2 (two) times daily.  Dispense: 20 tablet; Refill: 0 - Aerobic culture  2. Vulvovaginal candidiasis  - fluconazole (DIFLUCAN) 150 MG tablet; Take 1 pill. Repeat in 3 days  Dispense: 2 tablet; Refill: 0  RTC if extreme pain, fever, spreading redness or streaking occurs.    Kamil Hanigan A. Chauncey ReadingGann PA-C

## 2014-10-11 LAB — AEROBIC CULTURE

## 2014-10-16 ENCOUNTER — Other Ambulatory Visit: Payer: Self-pay | Admitting: Physician Assistant

## 2014-10-16 NOTE — Telephone Encounter (Signed)
Tc to pt, she states she is keeping a bandage over the boil so that her dogs to not scratch it, there is no heat to the area, but it is still slightly red and some drainage, is wondering about getting 2-3 days more of doxycycline called in to [facility] [place].

## 2014-10-17 ENCOUNTER — Other Ambulatory Visit: Payer: Self-pay | Admitting: Physician Assistant

## 2014-10-17 MED ORDER — CIPROFLOXACIN HCL 500 MG PO TABS: 500.0000 mg | ORAL_TABLET | Freq: Two times a day (BID) | ORAL | Status: DC

## 2014-10-17 NOTE — Telephone Encounter (Signed)
Prescription sent to pharmacy  For Cipro. If it does not clear after finishing antibiotic, she needs to be seen in office Brayant Dorr A. Chauncey Reading PA-C

## 2014-10-17 NOTE — Telephone Encounter (Signed)
Patient aware rx sent to pharmacy and will need to be seen if no better.

## 2015-04-04 ENCOUNTER — Encounter: Payer: Self-pay | Admitting: Pediatrics

## 2015-04-04 ENCOUNTER — Ambulatory Visit (INDEPENDENT_AMBULATORY_CARE_PROVIDER_SITE_OTHER): Payer: Managed Care, Other (non HMO) | Admitting: Pediatrics

## 2015-04-04 VITALS — BP 124/68 | HR 60 | Temp 97.2°F | Ht 65.0 in | Wt 204.2 lb

## 2015-04-04 DIAGNOSIS — N762 Acute vulvitis: Secondary | ICD-10-CM | POA: Diagnosis not present

## 2015-04-04 MED ORDER — MUPIROCIN 2 % EX OINT: 1.0000 "application " | TOPICAL_OINTMENT | Freq: Two times a day (BID) | CUTANEOUS | Status: DC

## 2015-04-04 MED ORDER — SULFAMETHOXAZOLE-TRIMETHOPRIM 800-160 MG PO TABS: 1.0000 | ORAL_TABLET | Freq: Two times a day (BID) | ORAL | Status: DC

## 2015-04-04 NOTE — Progress Notes (Signed)
Subjective:    Patient ID: [name], female    DOB: 07-23-1955, 59 y.o.   MRN: [id number]  CC: Groin Swelling   HPI: [name] is a 59 y.o. female presenting for Groin Swelling  Knot on R labia there for two days Has had boils before Sometimes drain on their own, sometimes has needed antibiotics Has been losing weight with weight watchers, apprx 88 lbs recently Noticed she had dampness on underwear/pants earlier today, thinks it might be draining now Has a hard place on her tailbone, broke it years ago sledding, now with weight loss is becoming more noticeable again, a hard knot that hurts when she sits for periods of time   Depression screen [facility] 2/9 04/04/2015 10/06/2014 03/05/2014  Decreased Interest 0 0 0  Down, Depressed, Hopeless 0 0 0  PHQ - 2 Score 0 0 0     Relevant past medical, surgical, family and social history reviewed and updated as indicated. Interim medical history since our last visit reviewed. Allergies and medications reviewed and updated.    ROS: Per HPI unless specifically indicated above  Past Medical History There are no active problems to display for this patient.   Current Outpatient Prescriptions  Medication Sig Dispense Refill  . mupirocin ointment (BACTROBAN) 2 % Place 1 application into the nose 2 (two) times daily. 22 g 0  . sulfamethoxazole-trimethoprim (BACTRIM DS,SEPTRA DS) 800-160 MG tablet Take 1 tablet by mouth 2 (two) times daily. 20 tablet 0   No current facility-administered medications for this visit.       Objective:    BP 124/68 mmHg  Pulse 60  Temp(Src) 97.2 F (36.2 C) (Oral)  Ht  (1.651 m)  Wt 204 lb 3.2 oz (92.625 kg)  BMI 33.98 kg/m2  Wt Readings from Last 3 Encounters:  04/04/15 204 lb 3.2 oz (92.625 kg)  10/06/14 250 lb 6.4 oz (113.581 kg)  03/05/14 288 lb 6.4 oz (130.817 kg)     Gen: NAD, alert, cooperative with exam, NCAT EYES: EOMI, no scleral injection or icterus ENT:  OP without  erythema LYMPH: no cervical LAD CV: NRRR, normal S1/S2, no murmur, distal pulses 2+ b/l Resp: CTABL, no wheezes, normal WOB Abd: +BS, soft, NTND. no guarding or organomegaly Ext: No edema, warm Neuro: Alert and oriented, strength equal b/l UE and LE, coordination grossly normal MSK: proximal gluteal cleft with hard cyst vs bone at base of coccyx, protruding slightly from contour of sacrum. No redness or skin breakdown, does have a dimple over it, unable to see base of dimple     Assessment & Plan:    Catlynn was seen today for labial abscess and cellulitis. Appears to be draining already. Some flutuance felt on exam, offered I&D, as site is already draining discussed probably not necessary, pt wants to wait, start abx as below, warm compresses 3-4 times a day. Will let me know if not improving.  Diagnoses and all orders for this visit:  Cellulitis of labia majora -     sulfamethoxazole-trimethoprim (BACTRIM DS,SEPTRA DS) 800-160 MG tablet; Take 1 tablet by mouth 2 (two) times daily. -     mupirocin ointment (BACTROBAN) 2 %; Place 1 application into the nose 2 (two) times daily.  Due for CPE, needs pap smear, mammogram, tailbone evaluation including xray (not able to get xray done today), consider intravaginal estrogen with dryness she is now having, preventing sexual activity.   Follow up plan: Return in about 4 weeks (  around 05/02/2015) for cpe.  Tracy Krasarol Vincent, MD [facility] 04/04/2015, 4:30 PM

## 2015-05-10 ENCOUNTER — Ambulatory Visit (INDEPENDENT_AMBULATORY_CARE_PROVIDER_SITE_OTHER): Payer: Managed Care, Other (non HMO)

## 2015-05-10 ENCOUNTER — Encounter: Payer: Self-pay | Admitting: Pediatrics

## 2015-05-10 ENCOUNTER — Ambulatory Visit (INDEPENDENT_AMBULATORY_CARE_PROVIDER_SITE_OTHER): Payer: Managed Care, Other (non HMO) | Admitting: Pediatrics

## 2015-05-10 VITALS — BP 119/73 | HR 70 | Temp 97.4°F | Ht 65.0 in | Wt 196.8 lb

## 2015-05-10 DIAGNOSIS — Z0000 Encounter for general adult medical examination without abnormal findings: Secondary | ICD-10-CM

## 2015-05-10 DIAGNOSIS — S3982XD Other specified injuries of lower back, subsequent encounter: Secondary | ICD-10-CM

## 2015-05-10 DIAGNOSIS — R21 Rash and other nonspecific skin eruption: Secondary | ICD-10-CM

## 2015-05-10 DIAGNOSIS — S3992XD Unspecified injury of lower back, subsequent encounter: Secondary | ICD-10-CM

## 2015-05-10 DIAGNOSIS — R739 Hyperglycemia, unspecified: Secondary | ICD-10-CM

## 2015-05-10 DIAGNOSIS — Z1211 Encounter for screening for malignant neoplasm of colon: Secondary | ICD-10-CM

## 2015-05-10 DIAGNOSIS — Z124 Encounter for screening for malignant neoplasm of cervix: Secondary | ICD-10-CM

## 2015-05-10 DIAGNOSIS — N941 Unspecified dyspareunia: Secondary | ICD-10-CM

## 2015-05-10 DIAGNOSIS — Z01419 Encounter for gynecological examination (general) (routine) without abnormal findings: Secondary | ICD-10-CM

## 2015-05-10 DIAGNOSIS — Z1239 Encounter for other screening for malignant neoplasm of breast: Secondary | ICD-10-CM

## 2015-05-10 LAB — POCT GLYCOSYLATED HEMOGLOBIN (HGB A1C): Hemoglobin A1C: 4.7

## 2015-05-10 NOTE — Progress Notes (Signed)
Subjective:    Patient ID: [name], female    DOB: 18-Aug-1955, 60 y.o.   MRN: [id number]  CC: Annual Exam   HPI: [name] is a 60 y.o. female presenting for Annual Exam  L arm painful today Noticed a bruise Getting better  Dyspareunia for years. 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  Has lost 100 lbs since joining weight watchers months ago Continuing to do well following the program and hopes to continue to lose weight With weight loss now has bony prominance on sacrum that is quite painful when she puts pressure on it She did injure her tailbone at some point prior to weight loss and wonders if it was broken   Depression screen [facility] 2/9 05/10/2015 04/04/2015 10/06/2014 03/05/2014  Decreased Interest 0 0 0 0  Down, Depressed, Hopeless 0 0 0 0  PHQ - 2 Score 0 0 0 0     Relevant past medical, surgical, family and social history reviewed and updated as indicated. Interim medical history since our last visit reviewed. Allergies and medications reviewed and updated.    ROS: Per HPI unless specifically indicated above  History  Smoking status  . Never Smoker   Smokeless tobacco  . Not on file    Past Medical History There are no active problems to display for this patient.   No current outpatient prescriptions on file.   No current facility-administered medications for this visit.       Objective:    BP 119/73 mmHg  Pulse 70  Temp(Src) 97.4 F (36.3 C) (Oral)  Ht _0  (1.651 m)  Wt 196 lb 12.8 oz (89.268 kg)  BMI 32.75 kg/m2  Wt Readings from Last 3 Encounters:  05/10/15 196 lb 12.8 oz (89.268 kg)  04/04/15 204 lb 3.2 oz (92.625 kg)  10/06/14 250 lb 6.4 oz (113.581 kg)     Gen: NAD, alert, cooperative with exam, NCAT EYES: EOMI, no scleral injection or icterus ENT:  TMs pearly gray b/l, OP without  erythema LYMPH: no cervical LAD Breast: fibrous tissue, no masses b/l CV: NRRR, normal S1/S2, no murmur, distal pulses 2+ b/l Resp: CTABL, no wheezes, normal WOB Abd: +BS, soft, NTND. no guarding or organomegaly Ext: No edema, warm Neuro: Alert and oriented, strength equal b/l UE and LE, coordination grossly normal MSK: normal muscle bulk GU: excoriation inside R labia, pain with insertion of smallest speculum, vaginal walls appeared scarred, cervix present with scarred down cervical os, no opening     Assessment & Plan:    [name] was seen today for annual exam.  Diagnoses and all orders for this visit:  Encounter for preventive health examination -     Pap IG and HPV (high risk) DNA detection -     MM Digital Screening; Future -     BMP8+EGFR -     CBC with Differential -     Lipid panel -     POCT glycosylated hemoglobin (Hb A1C)  Screening for cervical cancer -     Pap IG and HPV (high risk) DNA detection  Screening for breast cancer -     MM Digital Screening; Future  Hyperglycemia -     POCT glycosylated hemoglobin (Hb A1C)  Dyspareunia in female Scarring present in vagina -     Ambulatory referral to Gynecology  Rash and nonspecific  skin eruption -     Ambulatory referral to Dermatology  Injury of sacrum, subsequent encounter Occurred years ago, now with bony prominence of scarum -     DG Sacrum/Coccyx; Future  Screen for colon cancer -     Ambulatory referral to Gastroenterology    Follow up plan: Return in about 6 months (around 11/07/2015).  Assunta Found, MD [facility] 05/10/2015, 12:08 PM

## 2015-05-11 LAB — CBC WITH DIFFERENTIAL/PLATELET
BASOS: 0 %
Basophils Absolute: 0 10*3/uL (ref 0.0–0.2)
EOS (ABSOLUTE): 0.3 10*3/uL (ref 0.0–0.4)
Eos: 5 %
HEMATOCRIT: 38.4 % (ref 34.0–46.6)
HEMOGLOBIN: 13.4 g/dL (ref 11.1–15.9)
IMMATURE GRANS (ABS): 0 10*3/uL (ref 0.0–0.1)
Immature Granulocytes: 0 %
Lymphocytes Absolute: 1.4 10*3/uL (ref 0.7–3.1)
Lymphs: 28 %
MCH: 30.7 pg (ref 26.6–33.0)
MCHC: 34.9 g/dL (ref 31.5–35.7)
MCV: 88 fL (ref 79–97)
Monocytes Absolute: 0.4 10*3/uL (ref 0.1–0.9)
Monocytes: 9 %
Neutrophils Absolute: 2.9 10*3/uL (ref 1.4–7.0)
Neutrophils: 58 %
Platelets: 238 10*3/uL (ref 150–379)
RBC: 4.36 x10E6/uL (ref 3.77–5.28)
RDW: 13.9 % (ref 12.3–15.4)
WBC: 5.1 10*3/uL (ref 3.4–10.8)

## 2015-05-11 LAB — LIPID PANEL
CHOLESTEROL TOTAL: 103 mg/dL (ref 100–199)
Chol/HDL Ratio: 2.2 ratio units (ref 0.0–4.4)
HDL: 47 mg/dL (ref >39)
LDL Calculated: 45 mg/dL (ref 0–99)
Triglycerides: 56 mg/dL (ref 0–149)
VLDL CHOLESTEROL CAL: 11 mg/dL (ref 5–40)

## 2015-05-11 LAB — BMP8+EGFR
BUN / CREAT RATIO: 16 (ref 9–23)
BUN: 11 mg/dL (ref 6–24)
CO2: 26 mmol/L (ref 18–29)
CREATININE: 0.68 mg/dL (ref 0.57–1.00)
Calcium: 9.1 mg/dL (ref 8.7–10.2)
Chloride: 103 mmol/L (ref 96–106)
GFR calc Af Amer: 112 mL/min/{1.73_m2} (ref >59)
GFR, EST NON AFRICAN AMERICAN: 97 mL/min/{1.73_m2} (ref >59)
Glucose: 87 mg/dL (ref 65–99)
Potassium: 4 mmol/L (ref 3.5–5.2)
SODIUM: 142 mmol/L (ref 134–144)

## 2015-05-17 LAB — PAP IG AND HPV HIGH-RISK: PAP Smear Comment: 0

## 2015-05-17 LAB — HPV, LOW VOLUME (REFLEX): HPV low volume reflex: NEGATIVE

## 2015-05-20 ENCOUNTER — Telehealth: Payer: Self-pay | Admitting: Pediatrics

## 2015-05-22 ENCOUNTER — Encounter: Payer: Managed Care, Other (non HMO) | Admitting: Women's Health

## 2015-05-23 NOTE — Telephone Encounter (Signed)
Tried again, still no answer

## 2015-05-23 NOTE — Telephone Encounter (Signed)
Called back, no answer, left a VM, will try again.

## 2015-05-23 NOTE — Telephone Encounter (Signed)
Patient is not sure if she still needs to go to gynecologist. She would like to speak with you about it.

## 2015-05-27 ENCOUNTER — Ambulatory Visit (INDEPENDENT_AMBULATORY_CARE_PROVIDER_SITE_OTHER): Payer: Managed Care, Other (non HMO) | Admitting: Adult Health

## 2015-05-27 ENCOUNTER — Encounter: Payer: Self-pay | Admitting: Adult Health

## 2015-05-27 VITALS — BP 124/60 | HR 68 | Ht 63.25 in | Wt 192.5 lb

## 2015-05-27 DIAGNOSIS — N898 Other specified noninflammatory disorders of vagina: Secondary | ICD-10-CM | POA: Diagnosis not present

## 2015-05-27 DIAGNOSIS — N952 Postmenopausal atrophic vaginitis: Secondary | ICD-10-CM | POA: Insufficient documentation

## 2015-05-27 DIAGNOSIS — L298 Other pruritus: Secondary | ICD-10-CM | POA: Diagnosis not present

## 2015-05-27 HISTORY — DX: Other specified noninflammatory disorders of vagina: N89.8

## 2015-05-27 HISTORY — DX: Postmenopausal atrophic vaginitis: N95.2

## 2015-05-27 MED ORDER — ESTRADIOL 0.1 MG/GM VA CREA: TOPICAL_CREAM | VAGINAL | Status: DC

## 2015-05-27 NOTE — Progress Notes (Signed)
Subjective:     Patient ID: [name], female   DOB: 01/07/1956, 60 y.o.   MRN: [id number]  HPI [name] is a 60 year old white female, married, G0P0 in complaining of vaginal irritation and itching.She had pap 05/10/15 at [facility], and it was normal.She has lost 100 lbs on Weight [employer] since April.She wants to have sex again, her husband is only 76. PCP is Dr Oswaldo Done.  Review of Systems Patient denies any headaches, hearing loss, fatigue, blurred vision, shortness of breath, chest pain, abdominal pain, problems with bowel movements, urination, or intercourse(has been over a year since last sex,it hurts). No joint pain or mood swings.See HPI for positives. Reviewed past medical,surgical, social and family history. Reviewed medications and allergies.     Objective:   Physical Exam BP 124/60 mmHg  Pulse 68  Ht 5' 3.25" (1.607 m)  Wt 192 lb 8 oz (87.317 kg)  BMI 33.81 kg/m2   Skin warm and dry.Pelvic: external genitalia is normal in appearance for age, no lesions, vagina: has decreased color, moisture and rugae, atrophic,urethra has no lesions or masses noted, cervix:smooth and atrophic, uterus: normal size, shape and contour, non tender, no masses felt, adnexa: no masses or tenderness noted. Bladder is non tender and no masses felt.Discussed postmenopausal drying and shrinking of tissues.  Will try luvena and estrace cream, she wants to lose about 40 more lbs. Face time 20 minutes with 50% counseling.  Assessment:     Vaginal irritation Vaginal itching Vaginal atrophy     Plan:     Try luvena for vaginal moisture  Given 3 tubes estrace vaginal cream, use pea size amount 1-2 x daily to inner labia and introitus lot [id number] exp 3/19 Follow up in 4 weeks

## 2015-05-27 NOTE — Patient Instructions (Signed)
Use estrace 1-2 x daily to inner labia  Use luvena every 3 days in vagina Follow up in 4 weeks

## 2015-05-30 NOTE — Telephone Encounter (Signed)
Patient has went to gynecologist.  Patient states that her referral was suppose to be sent to Tracy Dawley, FNP not to [facility].

## 2015-06-14 NOTE — Telephone Encounter (Signed)
Pt wants to see Lynwood Dawley for derm, can we switch referral to her? Thanks!

## 2015-06-24 ENCOUNTER — Encounter: Payer: Self-pay | Admitting: Adult Health

## 2015-06-24 ENCOUNTER — Ambulatory Visit (INDEPENDENT_AMBULATORY_CARE_PROVIDER_SITE_OTHER): Payer: Managed Care, Other (non HMO) | Admitting: Adult Health

## 2015-06-24 VITALS — BP 130/72 | HR 68 | Ht 64.0 in | Wt 190.5 lb

## 2015-06-24 DIAGNOSIS — N898 Other specified noninflammatory disorders of vagina: Secondary | ICD-10-CM

## 2015-06-24 DIAGNOSIS — L298 Other pruritus: Secondary | ICD-10-CM | POA: Diagnosis not present

## 2015-06-24 DIAGNOSIS — N952 Postmenopausal atrophic vaginitis: Secondary | ICD-10-CM | POA: Diagnosis not present

## 2015-06-24 NOTE — Progress Notes (Signed)
Subjective:     Patient ID: [name], female   DOB: 02/02/1956, 60 y.o.   MRN: [id number]  HPI [name] is a 60 year old white female back in follow up of starting estrace and luvena and she is better, will still itch some at night.  Review of Systems Patient denies any headaches, hearing loss, fatigue, blurred vision, shortness of breath, chest pain, abdominal pain, problems with bowel movements, urination, or intercourse. No joint pain or mood swings.See HPI for positives. Reviewed past medical,surgical, social and family history. Reviewed medications and allergies.     Objective:   Physical Exam BP 130/72 mmHg  Pulse 68  Ht  (1.626 m)  Wt 190 lb 8 oz (86.41 kg)  BMI 32.68 kg/m2   Skin warm and dry.Pelvic: external genitalia has thin skin and is pale, no lesions, vagina: atrophic with decreased moisture and rugae and is pale, but looks more less pale and she says it feels better,urethra has no lesions or masses noted, cervix:tiny, uterus: normal size, shape and contour, non tender, no masses felt, adnexa: no masses or tenderness noted. Bladder is non tender and no masses felt.  Assessment:     Vaginal atrophy Vaginal irritation Vaginal itching     Plan:     Continue using estrace cream 2-3 x a week Follow up prn

## 2015-06-24 NOTE — Patient Instructions (Signed)
Use estrace 2-3 x weekly Follow up prn

## 2015-09-11 ENCOUNTER — Encounter: Payer: Self-pay | Admitting: *Deleted

## 2015-09-11 ENCOUNTER — Telehealth: Payer: Self-pay | Admitting: Pediatrics

## 2017-02-19 ENCOUNTER — Encounter (INDEPENDENT_AMBULATORY_CARE_PROVIDER_SITE_OTHER): Payer: Self-pay | Admitting: *Deleted

## 2017-06-21 NOTE — Patient Instructions (Signed)
Your procedure is scheduled on: 06/28/2017   [facility] at [facility]   AM.  Call this number if you have problems the morning of surgery: [phone]   Do not eat food or drink liquids :After Midnight.      Take these medicines the morning of surgery with A SIP OF WATER: None   Do not wear jewelry, make-up or nail polish.  Do not wear lotions, powders, or perfumes. You may wear deodorant.  Do not shave 48 hours prior to surgery.  Do not bring valuables to the hospital.  Contacts, dentures or bridgework may not be worn into surgery.  Leave suitcase in the car. After surgery it may be brought to your room.  For patients admitted to the hospital, checkout time is 11:00 AM the day of discharge.   Patients discharged the day of surgery will not be allowed to drive home.  :     Please read over the following fact sheets that you were given: Coughing and Deep Breathing, Surgical Site Infection Prevention, Anesthesia Post-op Instructions and Care and Recovery After Surgery    Cataract A cataract is a clouding of the lens of the eye. When a lens becomes cloudy, vision is reduced based on the degree and nature of the clouding. Many cataracts reduce vision to some degree. Some cataracts make people more near-sighted as they develop. Other cataracts increase glare. Cataracts that are ignored and become worse can sometimes look white. The white color can be seen through the pupil. CAUSES   Aging. However, cataracts may occur at any age, even in newborns.   Certain drugs.   Trauma to the eye.   Certain diseases such as diabetes.   Specific eye diseases such as chronic inflammation inside the eye or a sudden attack of a rare form of glaucoma.   Inherited or acquired medical problems.  SYMPTOMS   Gradual, progressive drop in vision in the affected eye.   Severe, rapid visual loss. This most often happens when trauma is the cause.  DIAGNOSIS  To detect a cataract, an eye doctor examines  the lens. Cataracts are best diagnosed with an exam of the eyes with the pupils enlarged (dilated) by drops.  TREATMENT  For an early cataract, vision may improve by using different eyeglasses or stronger lighting. If that does not help your vision, surgery is the only effective treatment. A cataract needs to be surgically removed when vision loss interferes with your everyday activities, such as driving, reading, or watching TV. A cataract may also have to be removed if it prevents examination or treatment of another eye problem. Surgery removes the cloudy lens and usually replaces it with a substitute lens (intraocular lens, IOL).  At a time when both you and your doctor agree, the cataract will be surgically removed. If you have cataracts in both eyes, only one is usually removed at a time. This allows the operated eye to heal and be out of danger from any possible problems after surgery (such as infection or poor wound healing). In rare cases, a cataract may be doing damage to your eye. In these cases, your caregiver may advise surgical removal right away. The vast majority of people who have cataract surgery have better vision afterward. HOME CARE INSTRUCTIONS  If you are not planning surgery, you may be asked to do the following:  Use different eyeglasses.   Use stronger or brighter lighting.   Ask your eye doctor about reducing your medicine dose  or changing medicines if it is thought that a medicine caused your cataract. Changing medicines does not make the cataract go away on its own.   Become familiar with your surroundings. Poor vision can lead to injury. Avoid bumping into things on the affected side. You are at a higher risk for tripping or falling.   Exercise extreme care when driving or operating machinery.   Wear sunglasses if you are sensitive to bright light or experiencing problems with glare.  SEEK IMMEDIATE MEDICAL CARE IF:   You have a worsening or sudden vision loss.    You notice redness, swelling, or increasing pain in the eye.   You have a fever.  Document Released: 04/20/2005 Document Revised: 04/09/2011 Document Reviewed: 12/12/2010 [facility] Patient Information 2012 [place].PATIENT INSTRUCTIONS POST-ANESTHESIA  IMMEDIATELY FOLLOWING SURGERY:  Do not drive or operate machinery for the first twenty four hours after surgery.  Do not make any important decisions for twenty four hours after surgery or while taking narcotic pain medications or sedatives.  If you develop intractable nausea and vomiting or a severe headache please notify your doctor immediately.  FOLLOW-UP:  Please make an appointment with your surgeon as instructed. You do not need to follow up with anesthesia unless specifically instructed to do so.  WOUND CARE INSTRUCTIONS (if applicable):  Keep a dry clean dressing on the anesthesia/puncture wound site if there is drainage.  Once the wound has quit draining you may leave it open to air.  Generally you should leave the bandage intact for twenty four hours unless there is drainage.  If the epidural site drains for more than 36-48 hours please call the anesthesia department.  QUESTIONS?:  Please feel free to call your physician or the hospital operator if you have any questions, and they will be happy to assist you.

## 2017-06-22 ENCOUNTER — Encounter (HOSPITAL_COMMUNITY)
Admission: RE | Admit: 2017-06-22 | Discharge: 2017-06-22 | Disposition: A | Discharge status: Home / Self Care | Payer: Managed Care, Other (non HMO) | Source: Ambulatory Visit | Attending: Ophthalmology | Admitting: Ophthalmology

## 2017-06-22 ENCOUNTER — Other Ambulatory Visit: Payer: Self-pay

## 2017-06-22 ENCOUNTER — Encounter (HOSPITAL_COMMUNITY): Payer: Self-pay

## 2017-06-22 DIAGNOSIS — Z01812 Encounter for preprocedural laboratory examination: Secondary | ICD-10-CM | POA: Diagnosis present

## 2017-06-22 DIAGNOSIS — H268 Other specified cataract: Secondary | ICD-10-CM | POA: Insufficient documentation

## 2017-06-22 DIAGNOSIS — Z01810 Encounter for preprocedural cardiovascular examination: Secondary | ICD-10-CM | POA: Insufficient documentation

## 2017-06-22 LAB — BASIC METABOLIC PANEL
Anion gap: 11 (ref 5–15)
BUN: 14 mg/dL (ref 6–20)
CO2: 26 mmol/L (ref 22–32)
CREATININE: 0.63 mg/dL (ref 0.44–1.00)
Calcium: 9.2 mg/dL (ref 8.9–10.3)
Chloride: 100 mmol/L — ABNORMAL LOW (ref 101–111)
GFR calc Af Amer: >60 mL/min (ref >60)
GFR calc non Af Amer: >60 mL/min (ref >60)
GLUCOSE: 84 mg/dL (ref 65–99)
Potassium: 4 mmol/L (ref 3.5–5.1)
Sodium: 137 mmol/L (ref 135–145)

## 2017-06-22 LAB — CBC WITH DIFFERENTIAL/PLATELET
BASOS PCT: 1 %
Basophils Absolute: 0 10*3/uL (ref 0.0–0.1)
Eosinophils Absolute: 0.2 10*3/uL (ref 0.0–0.7)
Eosinophils Relative: 7 %
HEMATOCRIT: 40.5 % (ref 36.0–46.0)
Hemoglobin: 13.3 g/dL (ref 12.0–15.0)
LYMPHS PCT: 35 %
Lymphs Abs: 1.2 10*3/uL (ref 0.7–4.0)
MCH: 30.1 pg (ref 26.0–34.0)
MCHC: 32.8 g/dL (ref 30.0–36.0)
MCV: 91.6 fL (ref 78.0–100.0)
MONO ABS: 0.3 10*3/uL (ref 0.1–1.0)
MONOS PCT: 10 %
Neutro Abs: 1.6 10*3/uL — ABNORMAL LOW (ref 1.7–7.7)
Neutrophils Relative %: 49 %
Platelets: 214 10*3/uL (ref 150–400)
RBC: 4.42 MIL/uL (ref 3.87–5.11)
RDW: 12.9 % (ref 11.5–15.5)
WBC: 3.4 10*3/uL — ABNORMAL LOW (ref 4.0–10.5)

## 2017-06-28 ENCOUNTER — Encounter (HOSPITAL_COMMUNITY)
Admission: RE | Disposition: A | Discharge status: Home / Self Care | Payer: Self-pay | Source: Ambulatory Visit | Attending: Ophthalmology

## 2017-06-28 ENCOUNTER — Ambulatory Visit (HOSPITAL_COMMUNITY): Payer: Managed Care, Other (non HMO) | Admitting: Anesthesiology

## 2017-06-28 ENCOUNTER — Encounter (HOSPITAL_COMMUNITY): Payer: Self-pay | Admitting: Anesthesiology

## 2017-06-28 ENCOUNTER — Ambulatory Visit (HOSPITAL_COMMUNITY)
Admission: RE | Admit: 2017-06-28 | Discharge: 2017-06-28 | Disposition: A | Discharge status: Home / Self Care | Payer: Managed Care, Other (non HMO) | Source: Ambulatory Visit | Attending: Ophthalmology | Admitting: Ophthalmology

## 2017-06-28 DIAGNOSIS — M1990 Unspecified osteoarthritis, unspecified site: Secondary | ICD-10-CM | POA: Diagnosis not present

## 2017-06-28 DIAGNOSIS — Z880 Allergy status to penicillin: Secondary | ICD-10-CM | POA: Diagnosis not present

## 2017-06-28 DIAGNOSIS — H2511 Age-related nuclear cataract, right eye: Secondary | ICD-10-CM | POA: Insufficient documentation

## 2017-06-28 HISTORY — PX: CATARACT EXTRACTION W/PHACO: SHX586

## 2017-06-28 SURGERY — PHACOEMULSIFICATION, CATARACT, WITH IOL INSERTION
Anesthesia: Monitor Anesthesia Care | Site: Eye | Laterality: Right

## 2017-06-28 MED ORDER — MIDAZOLAM HCL 2 MG/2ML IJ SOLN
INTRAMUSCULAR | Status: AC
  Filled 2017-06-28: qty 2

## 2017-06-28 MED ORDER — MIDAZOLAM HCL 2 MG/2ML IJ SOLN
1.0000 mg | Freq: Once | INTRAMUSCULAR | Status: AC | PRN
  Administered 2017-06-28: 2 mg via INTRAVENOUS

## 2017-06-28 MED ORDER — TETRACAINE HCL 0.5 % OP SOLN
1.0000 [drp] | OPHTHALMIC | Status: AC
  Administered 2017-06-28 (×2): 1 [drp] via OPHTHALMIC

## 2017-06-28 MED ORDER — BSS IO SOLN
INTRAOCULAR | Status: DC | PRN
  Administered 2017-06-28: 15 mL

## 2017-06-28 MED ORDER — EPINEPHRINE PF 1 MG/ML IJ SOLN
INTRAOCULAR | Status: DC | PRN
  Administered 2017-06-28: 500 mL

## 2017-06-28 MED ORDER — LIDOCAINE HCL (PF) 1 % IJ SOLN
INTRAMUSCULAR | Status: DC | PRN
  Administered 2017-06-28: .4 mL

## 2017-06-28 MED ORDER — POVIDONE-IODINE 5 % OP SOLN
OPHTHALMIC | Status: DC | PRN
  Administered 2017-06-28: 1 via OPHTHALMIC

## 2017-06-28 MED ORDER — LACTATED RINGERS IV SOLN
INTRAVENOUS | Status: DC
  Administered 2017-06-28: 10:00:00 via INTRAVENOUS

## 2017-06-28 MED ORDER — ONDANSETRON 4 MG PO TBDP
ORAL_TABLET | ORAL | Status: AC
  Filled 2017-06-28: qty 1

## 2017-06-28 MED ORDER — PHENYLEPHRINE HCL 2.5 % OP SOLN
1.0000 [drp] | OPHTHALMIC | Status: AC
  Administered 2017-06-28 (×2): 1 [drp] via OPHTHALMIC

## 2017-06-28 MED ORDER — LIDOCAINE HCL 3.5 % OP GEL
1.0000 "application " | Freq: Once | OPHTHALMIC | Status: AC
  Administered 2017-06-28: 1 via OPHTHALMIC

## 2017-06-28 MED ORDER — NEOMYCIN-POLYMYXIN-DEXAMETH 3.5-10000-0.1 OP SUSP
OPHTHALMIC | Status: DC | PRN
  Administered 2017-06-28: 2 [drp] via OPHTHALMIC

## 2017-06-28 MED ORDER — ONDANSETRON 4 MG PO TBDP
4.0000 mg | ORAL_TABLET | Freq: Once | ORAL | Status: AC
  Administered 2017-06-28: 4 mg via ORAL

## 2017-06-28 MED ORDER — PROVISC 10 MG/ML IO SOLN
INTRAOCULAR | Status: DC | PRN
  Administered 2017-06-28: 0.85 mL via INTRAOCULAR

## 2017-06-28 MED ORDER — CYCLOPENTOLATE-PHENYLEPHRINE 0.2-1 % OP SOLN
1.0000 [drp] | OPHTHALMIC | Status: AC
  Administered 2017-06-28 (×2): 1 [drp] via OPHTHALMIC

## 2017-06-28 SURGICAL SUPPLY — 12 items

## 2017-06-28 NOTE — H&P (Signed)
I have reviewed the H&P, the patient was re-examined, and I have identified no interval changes in medical condition and plan of care since the history and physical of record  

## 2017-06-28 NOTE — Anesthesia Procedure Notes (Signed)
Procedure Name: MAC Date/Time: 06/28/2017 10:25 AM Performed by: Andree Elk Ahtziri Jeffries A, CRNA Pre-anesthesia Checklist: Patient identified, Emergency Drugs available, Suction available, Timeout performed and Patient being monitored Patient Re-evaluated:Patient Re-evaluated prior to induction Oxygen Delivery Method: Nasal Cannula

## 2017-06-28 NOTE — Anesthesia Preprocedure Evaluation (Signed)
Anesthesia Evaluation  Patient identified by MRN, date of birth, ID band Patient awake    Airway Mallampati: I       Dental no notable dental hx.    Pulmonary neg pulmonary ROS,    Pulmonary exam normal        Cardiovascular Exercise Tolerance: Good negative cardio ROS   Rhythm:Regular Rate:Normal  22-Jun-2017 13:55:24 [place] [facility]300 ROUTINE RECORD Normal sinus rhythm Right bundle branch block Septal infarct , age undetermined   Neuro/Psych    GI/Hepatic negative GI ROS, Neg liver ROS,   Endo/Other  negative endocrine ROS  Renal/GU negative Renal ROS     Musculoskeletal negative musculoskeletal ROS (+) Arthritis , Osteoarthritis,    Abdominal Normal abdominal exam  (+)   Peds  Hematology negative hematology ROS (+)   Anesthesia Other Findings   Reproductive/Obstetrics negative OB ROS                             Anesthesia Physical Anesthesia Plan  ASA: II  Anesthesia Plan: MAC   Post-op Pain Management:    Induction:   PONV Risk Score and Plan:   Airway Management Planned: Nasal Cannula  Additional Equipment:   Intra-op Plan:   Post-operative Plan:   Informed Consent: I have reviewed the patients History and Physical, chart, labs and discussed the procedure including the risks, benefits and alternatives for the proposed anesthesia with the patient or authorized representative who has indicated his/her understanding and acceptance.   Dental advisory given  Plan Discussed with: CRNA  Anesthesia Plan Comments:         Anesthesia Quick Evaluation

## 2017-06-28 NOTE — Discharge Instructions (Signed)

## 2017-06-28 NOTE — Anesthesia Postprocedure Evaluation (Signed)
Anesthesia Post Note  Patient: [name]  Procedure(s) Performed: CATARACT EXTRACTION PHACO AND INTRAOCULAR LENS PLACEMENT (IOC) (Right Eye)  Patient location during evaluation: Short Stay Anesthesia Type: MAC Level of consciousness: awake and alert, oriented and patient cooperative Pain management: pain level controlled Vital Signs Assessment: post-procedure vital signs reviewed and stable Respiratory status: spontaneous breathing and respiratory function stable Cardiovascular status: stable Postop Assessment: no apparent nausea or vomiting Anesthetic complications: no     Last Vitals:  Vitals:   06/28/17 0948  BP: 131/74  Pulse: 73  Resp: 16  Temp: 36.5 C  SpO2: 97%    Last Pain:  Vitals:   06/28/17 0948  TempSrc: Oral                 Hyrum Shaneyfelt A

## 2017-06-28 NOTE — Transfer of Care (Signed)
Immediate Anesthesia Transfer of Care Note  Patient: [name]  Procedure(s) Performed: CATARACT EXTRACTION PHACO AND INTRAOCULAR LENS PLACEMENT (IOC) (Right Eye)  Patient Location: Short Stay  Anesthesia Type:MAC  Level of Consciousness: awake, alert , oriented and patient cooperative  Airway & Oxygen Therapy: Patient Spontanous Breathing  Post-op Assessment: Report given to RN and Post -op Vital signs reviewed and stable  Post vital signs: Reviewed and stable  Last Vitals:  Vitals:   06/28/17 0948  BP: 131/74  Pulse: 73  Resp: 16  Temp: 36.5 C  SpO2: 97%    Last Pain:  Vitals:   06/28/17 0948  TempSrc: Oral      Patients Stated Pain Goal: 7 (17/91/50 5697)  Complications: No apparent anesthesia complications

## 2017-06-28 NOTE — Op Note (Signed)
Date of Admission: 06/28/2017  Date of Surgery: 06/28/2017  Pre-Op Dx: Cataract Right  Eye  Post-Op Dx: Senile Nuclear Cataract  Right  Eye,  Dx Code H25.11  Surgeon: Tonny Branch, M.D.  Assistants: None  Anesthesia: Topical with MAC  Indications: Painless, progressive loss of vision with compromise of daily activities.  Surgery: Cataract Extraction with Intraocular lens Implant Right Eye  Discription: The patient had dilating drops and viscous lidocaine placed into the Right eye in the pre-op holding area. After transfer to the operating room, a time out was performed. The patient was then prepped and draped. Beginning with a 79m blade a paracentesis port was made at the surgeon's 2 o'clock position. The anterior chamber was then filled with 1% non-preserved lidocaine. This was followed by filling the anterior chamber with Provisc.  A 2.467mkeratome blade was used to make a clear corneal incision at the temporal limbus.  A bent cystatome needle was used to create a continuous tear capsulotomy. Hydrodissection was performed with balanced salt solution on a Fine canula. The lens nucleus was then removed using the phacoemulsification handpiece. Residual cortex was removed with the I&A handpiece. The anterior chamber and capsular bag were refilled with Provisc. A posterior chamber intraocular lens was placed into the capsular bag with it's injector. The implant was positioned with the Kuglan hook. The Provisc was then removed from the anterior chamber and capsular bag with the I&A handpiece. Stromal hydration of the main incision and paracentesis port was performed with BSS on a Fine canula. The wounds were tested for leak which was negative. The patient tolerated the procedure well. There were no operative complications. The patient was then transferred to the recovery room in stable condition.  Complications: None  Specimen: None  EBL: None  Prosthetic device: Abbott Technis, PCB00, power 19.5,  SN [id number]

## 2017-06-29 ENCOUNTER — Encounter (HOSPITAL_COMMUNITY): Payer: Self-pay | Admitting: Ophthalmology

## 2017-12-26 IMAGING — CR DG SACRUM/COCCYX 2+V
3 series · 3 of 3 positions shown · non-contrast
Comparison: None.

CLINICAL DATA: Pain.  No known injury.  Initial evaluation.

EXAM:
SACRUM AND COCCYX - 2+ VIEW

[view not recorded (1 of 3)]
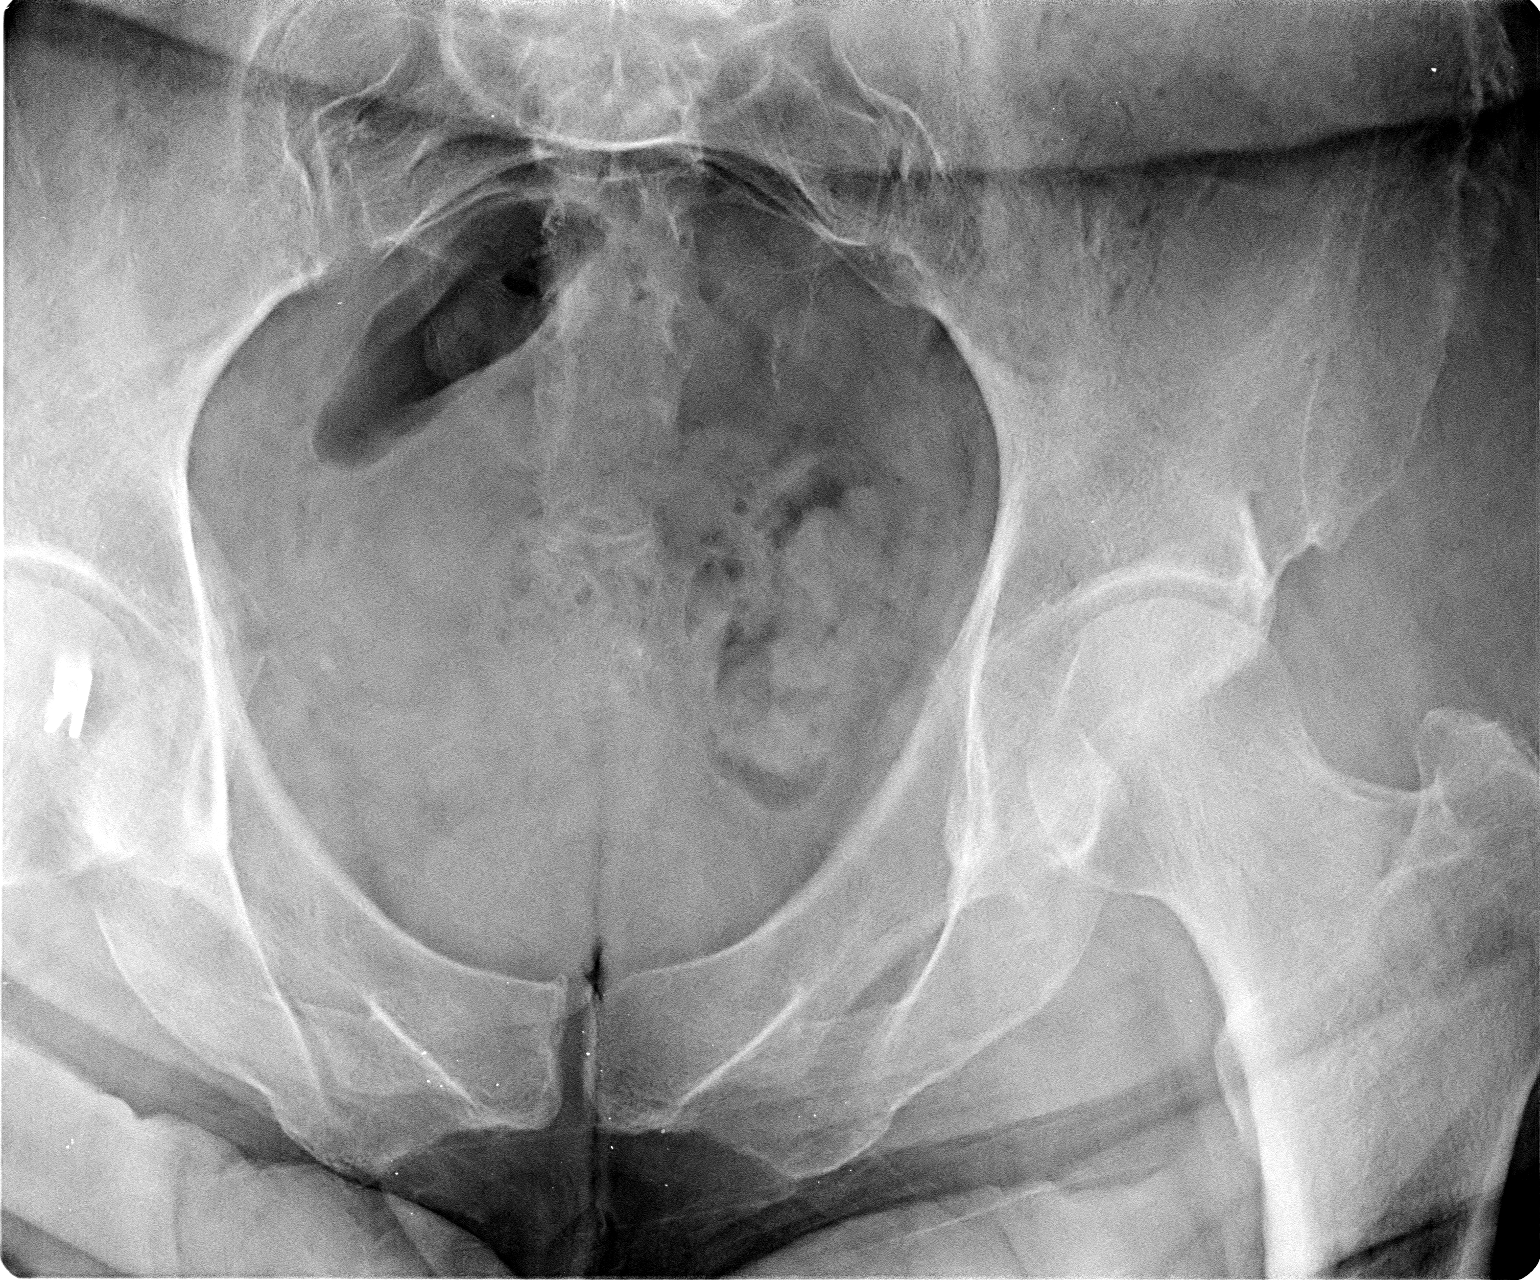

[view not recorded (2 of 3)]
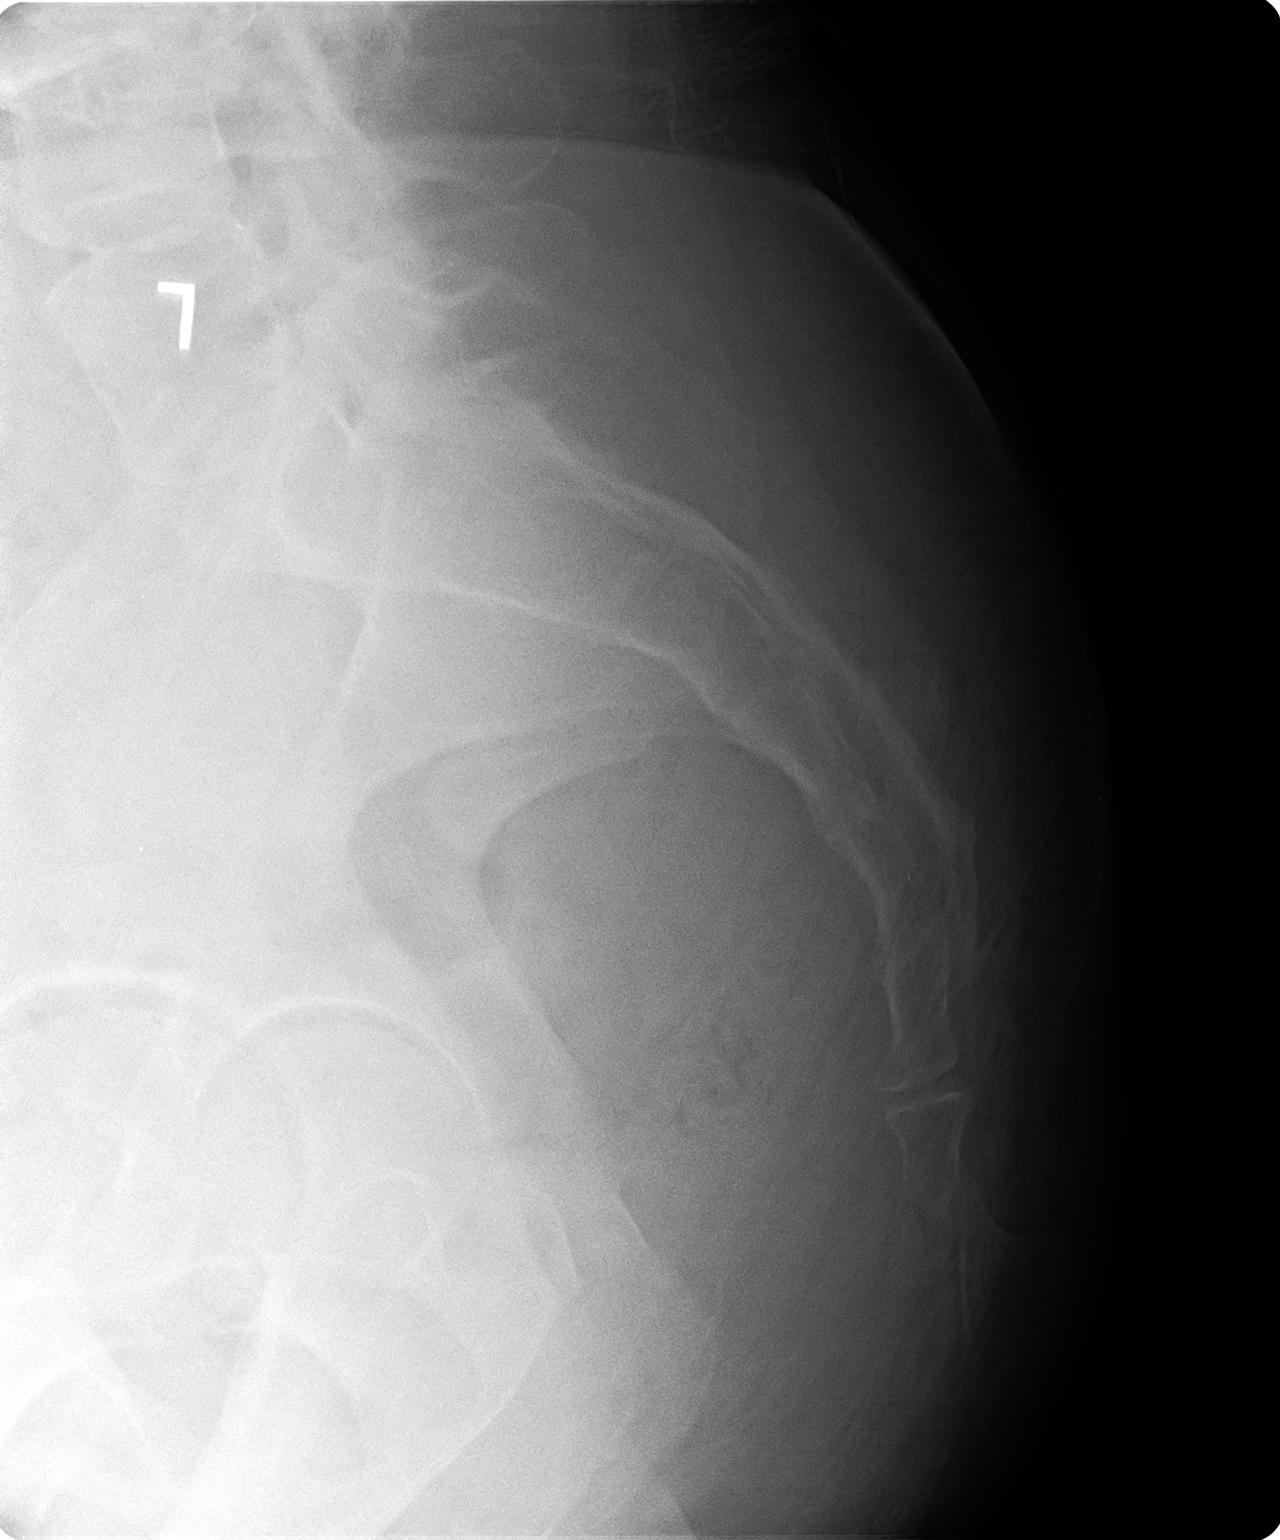

[view not recorded (3 of 3)]
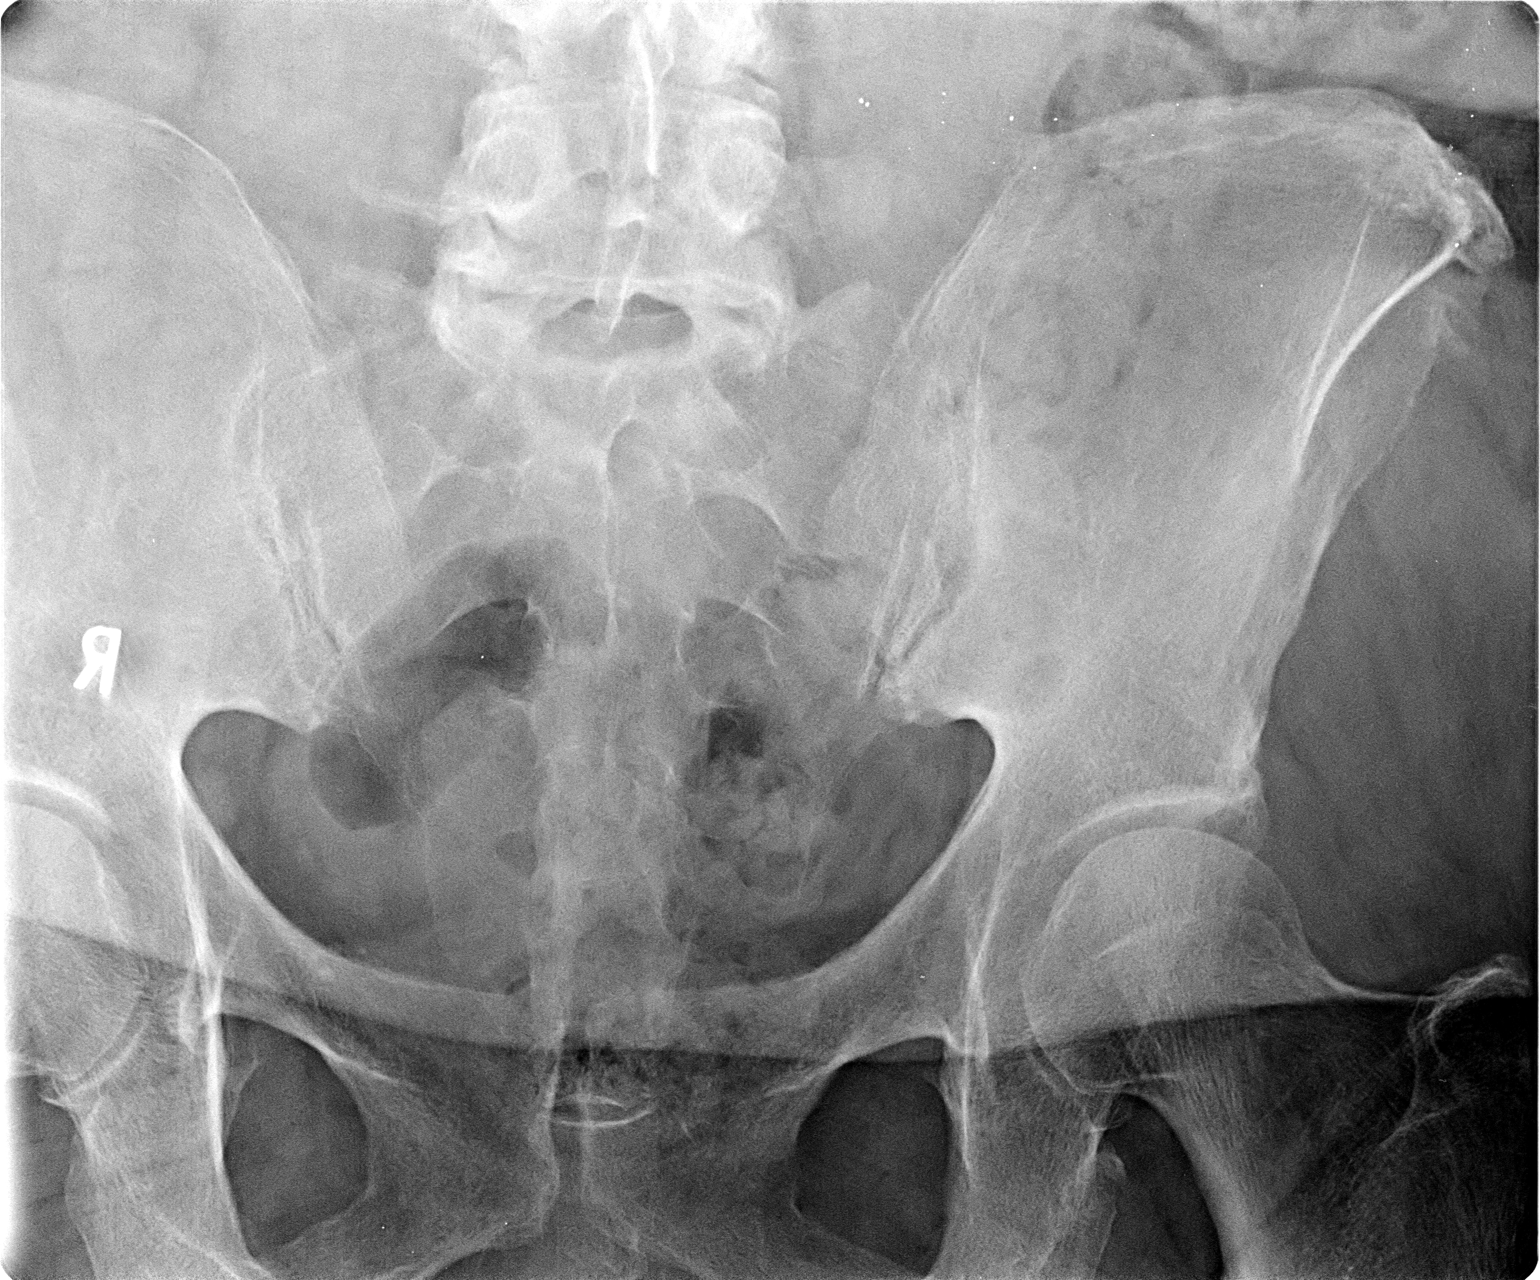

[3 of 3 positions shown; findings below may reference images not displayed]

FINDINGS: Diffuse osteopenia degenerative change. No acute bony abnormality.
No evidence of fracture or dislocation.
IMPRESSION: Diffuse osteopenia degenerative change. No acute or focal
abnormality.

## 2018-05-03 ENCOUNTER — Other Ambulatory Visit: Payer: Self-pay | Admitting: Orthopedic Surgery

## 2018-05-03 DIAGNOSIS — M25511 Pain in right shoulder: Secondary | ICD-10-CM

## 2018-05-13 ENCOUNTER — Ambulatory Visit
Admission: RE | Admit: 2018-05-13 | Discharge: 2018-05-13 | Disposition: A | Discharge status: Home / Self Care | Payer: Managed Care, Other (non HMO) | Source: Ambulatory Visit | Attending: Orthopedic Surgery | Admitting: Orthopedic Surgery

## 2018-05-13 ENCOUNTER — Other Ambulatory Visit: Payer: Managed Care, Other (non HMO)

## 2018-05-13 DIAGNOSIS — M25511 Pain in right shoulder: Secondary | ICD-10-CM

## 2023-12-09 ENCOUNTER — Other Ambulatory Visit: Payer: Self-pay

## 2023-12-09 ENCOUNTER — Inpatient Hospital Stay (HOSPITAL_COMMUNITY)

## 2023-12-09 ENCOUNTER — Emergency Department (HOSPITAL_COMMUNITY)

## 2023-12-09 ENCOUNTER — Encounter (HOSPITAL_COMMUNITY): Payer: Self-pay

## 2023-12-09 ENCOUNTER — Inpatient Hospital Stay (HOSPITAL_COMMUNITY)
Admission: EM | Admit: 2023-12-09 | Discharge: 2023-12-12 | DRG: 432 | Disposition: A | Discharge status: Home / Self Care | Attending: Family Medicine | Admitting: Family Medicine

## 2023-12-09 DIAGNOSIS — Z801 Family history of malignant neoplasm of trachea, bronchus and lung: Secondary | ICD-10-CM | POA: Diagnosis not present

## 2023-12-09 DIAGNOSIS — Z9981 Dependence on supplemental oxygen: Secondary | ICD-10-CM | POA: Diagnosis not present

## 2023-12-09 DIAGNOSIS — I509 Heart failure, unspecified: Secondary | ICD-10-CM | POA: Diagnosis not present

## 2023-12-09 DIAGNOSIS — Z9841 Cataract extraction status, right eye: Secondary | ICD-10-CM | POA: Diagnosis not present

## 2023-12-09 DIAGNOSIS — Z961 Presence of intraocular lens: Secondary | ICD-10-CM | POA: Diagnosis present

## 2023-12-09 DIAGNOSIS — Z8249 Family history of ischemic heart disease and other diseases of the circulatory system: Secondary | ICD-10-CM

## 2023-12-09 DIAGNOSIS — R160 Hepatomegaly, not elsewhere classified: Secondary | ICD-10-CM | POA: Diagnosis present

## 2023-12-09 DIAGNOSIS — I4891 Unspecified atrial fibrillation: Secondary | ICD-10-CM | POA: Diagnosis present

## 2023-12-09 DIAGNOSIS — M7989 Other specified soft tissue disorders: Secondary | ICD-10-CM | POA: Diagnosis present

## 2023-12-09 DIAGNOSIS — N390 Urinary tract infection, site not specified: Secondary | ICD-10-CM | POA: Diagnosis present

## 2023-12-09 DIAGNOSIS — Z9049 Acquired absence of other specified parts of digestive tract: Secondary | ICD-10-CM

## 2023-12-09 DIAGNOSIS — K7581 Nonalcoholic steatohepatitis (NASH): Secondary | ICD-10-CM | POA: Diagnosis present

## 2023-12-09 DIAGNOSIS — R600 Localized edema: Secondary | ICD-10-CM | POA: Diagnosis present

## 2023-12-09 DIAGNOSIS — E66813 Obesity, class 3: Secondary | ICD-10-CM | POA: Diagnosis present

## 2023-12-09 DIAGNOSIS — I5021 Acute systolic (congestive) heart failure: Secondary | ICD-10-CM | POA: Diagnosis not present

## 2023-12-09 DIAGNOSIS — J811 Chronic pulmonary edema: Secondary | ICD-10-CM | POA: Diagnosis present

## 2023-12-09 DIAGNOSIS — R0609 Other forms of dyspnea: Secondary | ICD-10-CM | POA: Diagnosis not present

## 2023-12-09 DIAGNOSIS — Z96653 Presence of artificial knee joint, bilateral: Secondary | ICD-10-CM | POA: Diagnosis present

## 2023-12-09 DIAGNOSIS — Z6841 Body Mass Index (BMI) 40.0 and over, adult: Secondary | ICD-10-CM

## 2023-12-09 DIAGNOSIS — K7460 Unspecified cirrhosis of liver: Principal | ICD-10-CM | POA: Diagnosis present

## 2023-12-09 DIAGNOSIS — J9601 Acute respiratory failure with hypoxia: Principal | ICD-10-CM | POA: Diagnosis present

## 2023-12-09 DIAGNOSIS — Z79899 Other long term (current) drug therapy: Secondary | ICD-10-CM | POA: Diagnosis not present

## 2023-12-09 DIAGNOSIS — I5022 Chronic systolic (congestive) heart failure: Secondary | ICD-10-CM | POA: Diagnosis not present

## 2023-12-09 DIAGNOSIS — Z833 Family history of diabetes mellitus: Secondary | ICD-10-CM | POA: Diagnosis not present

## 2023-12-09 DIAGNOSIS — I5032 Chronic diastolic (congestive) heart failure: Secondary | ICD-10-CM | POA: Diagnosis not present

## 2023-12-09 LAB — CBC WITH DIFFERENTIAL/PLATELET
Abs Immature Granulocytes: 0.04 K/uL (ref 0.00–0.07)
Basophils Absolute: 0.1 K/uL (ref 0.0–0.1)
Basophils Relative: 1 %
Eosinophils Absolute: 0.5 K/uL (ref 0.0–0.5)
Eosinophils Relative: 7 %
HCT: 38.7 % (ref 36.0–46.0)
Hemoglobin: 12.6 g/dL (ref 12.0–15.0)
Immature Granulocytes: 1 %
Lymphocytes Relative: 15 %
Lymphs Abs: 1.1 K/uL (ref 0.7–4.0)
MCH: 30.5 pg (ref 26.0–34.0)
MCHC: 32.6 g/dL (ref 30.0–36.0)
MCV: 93.7 fL (ref 80.0–100.0)
Monocytes Absolute: 0.9 K/uL (ref 0.1–1.0)
Monocytes Relative: 13 %
Neutro Abs: 4.4 K/uL (ref 1.7–7.7)
Neutrophils Relative %: 63 %
Platelets: 429 K/uL — ABNORMAL HIGH (ref 150–400)
RBC: 4.13 MIL/uL (ref 3.87–5.11)
RDW: 19.3 % — ABNORMAL HIGH (ref 11.5–15.5)
WBC: 6.9 K/uL (ref 4.0–10.5)
nRBC: 0 % (ref 0.0–0.2)

## 2023-12-09 LAB — TROPONIN I (HIGH SENSITIVITY)
Troponin I (High Sensitivity): 17 ng/L (ref <18)
Troponin I (High Sensitivity): 18 ng/L — ABNORMAL HIGH (ref <18)

## 2023-12-09 LAB — BRAIN NATRIURETIC PEPTIDE: B Natriuretic Peptide: 236 pg/mL — ABNORMAL HIGH (ref 0.0–100.0)

## 2023-12-09 LAB — URINALYSIS, ROUTINE W REFLEX MICROSCOPIC
Bilirubin Urine: NEGATIVE
Glucose, UA: NEGATIVE mg/dL
Hgb urine dipstick: NEGATIVE
Ketones, ur: NEGATIVE mg/dL
Nitrite: NEGATIVE
Protein, ur: NEGATIVE mg/dL
Specific Gravity, Urine: 1.006 (ref 1.005–1.030)
pH: 5 (ref 5.0–8.0)

## 2023-12-09 LAB — COMPREHENSIVE METABOLIC PANEL WITH GFR
ALT: 63 U/L — ABNORMAL HIGH (ref 0–44)
AST: 199 U/L — ABNORMAL HIGH (ref 15–41)
Albumin: 2.1 g/dL — ABNORMAL LOW (ref 3.5–5.0)
Alkaline Phosphatase: 758 U/L — ABNORMAL HIGH (ref 38–126)
Anion gap: 14 (ref 5–15)
BUN: 18 mg/dL (ref 8–23)
CO2: 27 mmol/L (ref 22–32)
Calcium: 8.6 mg/dL — ABNORMAL LOW (ref 8.9–10.3)
Chloride: 96 mmol/L — ABNORMAL LOW (ref 98–111)
Creatinine, Ser: 1.03 mg/dL — ABNORMAL HIGH (ref 0.44–1.00)
GFR, Estimated: 60 mL/min — ABNORMAL LOW (ref >60)
Glucose, Bld: 95 mg/dL (ref 70–99)
Potassium: 3.7 mmol/L (ref 3.5–5.1)
Sodium: 137 mmol/L (ref 135–145)
Total Bilirubin: 11.7 mg/dL — ABNORMAL HIGH (ref 0.0–1.2)
Total Protein: 6.4 g/dL — ABNORMAL LOW (ref 6.5–8.1)

## 2023-12-09 MED ORDER — SODIUM CHLORIDE 0.9% FLUSH
3.0000 mL | Freq: Two times a day (BID) | INTRAVENOUS | Status: DC
  Administered 2023-12-09 – 2023-12-11 (×4): 3 mL via INTRAVENOUS

## 2023-12-09 MED ORDER — FUROSEMIDE 10 MG/ML IJ SOLN
40.0000 mg | Freq: Once | INTRAMUSCULAR | Status: AC
  Administered 2023-12-09: 40 mg via INTRAVENOUS
  Filled 2023-12-09: qty 4

## 2023-12-09 MED ORDER — BISACODYL 10 MG RE SUPP: 10.0000 mg | Freq: Every day | RECTAL | Status: DC | PRN

## 2023-12-09 MED ORDER — ACETAMINOPHEN 650 MG RE SUPP: 650.0000 mg | Freq: Four times a day (QID) | RECTAL | Status: DC | PRN

## 2023-12-09 MED ORDER — SPIRONOLACTONE 25 MG PO TABS
25.0000 mg | ORAL_TABLET | Freq: Every day | ORAL | Status: DC
  Administered 2023-12-09 – 2023-12-10 (×2): 25 mg via ORAL
  Filled 2023-12-09 (×2): qty 1

## 2023-12-09 MED ORDER — SODIUM CHLORIDE 0.9 % IV SOLN: INTRAVENOUS | Status: AC | PRN

## 2023-12-09 MED ORDER — POLYETHYLENE GLYCOL 3350 17 G PO PACK: 17.0000 g | PACK | Freq: Every day | ORAL | Status: DC | PRN

## 2023-12-09 MED ORDER — ONDANSETRON HCL 4 MG/2ML IJ SOLN: 4.0000 mg | Freq: Four times a day (QID) | INTRAMUSCULAR | Status: DC | PRN

## 2023-12-09 MED ORDER — SODIUM CHLORIDE 0.9% FLUSH: 3.0000 mL | INTRAVENOUS | Status: DC | PRN

## 2023-12-09 MED ORDER — HEPARIN SODIUM (PORCINE) 5000 UNIT/ML IJ SOLN
5000.0000 [IU] | Freq: Three times a day (TID) | INTRAMUSCULAR | Status: DC
  Administered 2023-12-09 – 2023-12-12 (×9): 5000 [IU] via SUBCUTANEOUS
  Filled 2023-12-09 (×9): qty 1

## 2023-12-09 MED ORDER — ONDANSETRON HCL 4 MG PO TABS: 4.0000 mg | ORAL_TABLET | Freq: Four times a day (QID) | ORAL | Status: DC | PRN

## 2023-12-09 MED ORDER — ACETAMINOPHEN 325 MG PO TABS: 650.0000 mg | ORAL_TABLET | Freq: Four times a day (QID) | ORAL | Status: DC | PRN

## 2023-12-09 MED ORDER — CEFTRIAXONE SODIUM 1 G IJ SOLR
1.0000 g | INTRAMUSCULAR | Status: DC
  Administered 2023-12-09 – 2023-12-10 (×2): 1 g via INTRAVENOUS
  Filled 2023-12-09 (×2): qty 10

## 2023-12-09 MED ORDER — ALBUTEROL SULFATE (2.5 MG/3ML) 0.083% IN NEBU: 2.5000 mg | INHALATION_SOLUTION | RESPIRATORY_TRACT | Status: DC | PRN

## 2023-12-09 MED ORDER — SODIUM CHLORIDE 0.9% FLUSH
3.0000 mL | Freq: Two times a day (BID) | INTRAVENOUS | Status: DC
  Administered 2023-12-09 – 2023-12-12 (×6): 3 mL via INTRAVENOUS

## 2023-12-09 MED ORDER — POLYETHYLENE GLYCOL 3350 17 G PO PACK
17.0000 g | PACK | Freq: Every day | ORAL | Status: DC
  Administered 2023-12-09 – 2023-12-12 (×4): 17 g via ORAL
  Filled 2023-12-09 (×4): qty 1

## 2023-12-09 MED ORDER — ORAL CARE MOUTH RINSE: 15.0000 mL | OROMUCOSAL | Status: DC | PRN

## 2023-12-09 MED ORDER — TRAZODONE HCL 50 MG PO TABS: 50.0000 mg | ORAL_TABLET | Freq: Every evening | ORAL | Status: DC | PRN

## 2023-12-09 MED ORDER — FUROSEMIDE 10 MG/ML IJ SOLN
20.0000 mg | Freq: Two times a day (BID) | INTRAMUSCULAR | Status: DC
  Administered 2023-12-09 – 2023-12-11 (×5): 20 mg via INTRAVENOUS
  Filled 2023-12-09 (×4): qty 2

## 2023-12-09 NOTE — Progress Notes (Signed)
 Pt arrived via stretcher from ED to room 329. Pt ambulatory without assist from stretcher to bathroom and then to bed. Gait steady. Slight dyspnea with exertion, remains on 2 lpm [place] O2. Oriented to room and safety procedures, call bell within reach. Pt and husband state understanding.

## 2023-12-09 NOTE — ED Notes (Signed)
 Patient assisted to bedside commode x 3 O2 saturation decreased to 87%. Placed patient on 2L via [place]

## 2023-12-09 NOTE — ED Provider Notes (Signed)
 [place] [facility] AT [facility] Provider Note   CSN: [id number] Arrival date & time: 12/09/23  1135     Patient presents with: Leg Swelling   [name] is a 68 y.o. female.   68 year old female presents for evaluation of leg swelling.  She states this been going on for a while, but usually improves on its own.  States this time it has not improved and has just been getting worse.  She also admits to some increased shortness of breath and shortness of breath with exertion.  States she has no medical history does not take any medicines.  Denies any chest pain or any other symptoms or concerns at this time.        Prior to Admission medications   Medication Sig Start Date End Date Taking? Authorizing Provider  acetaminophen  (TYLENOL ) 650 MG CR tablet Take 650 mg by mouth every 8 (eight) hours as needed for pain.   Yes [provider]  polyethylene glycol (MIRALAX  / GLYCOLAX ) packet Take 17 g by mouth daily.   Yes [provider]    Allergies: Penicillins    Review of Systems  Constitutional:  Negative for chills and fever.  HENT:  Negative for ear pain and sore throat.   Eyes:  Negative for pain and visual disturbance.  Respiratory:  Positive for shortness of breath. Negative for cough.   Cardiovascular:  Positive for leg swelling. Negative for chest pain and palpitations.  Gastrointestinal:  Negative for abdominal pain and vomiting.  Genitourinary:  Negative for dysuria and hematuria.  Musculoskeletal:  Negative for arthralgias and back pain.  Skin:  Negative for color change and rash.  Neurological:  Negative for seizures and syncope.  All other systems reviewed and are negative.   Updated Vital Signs BP (!) 167/84 (BP Location: Right Wrist)   Pulse 92   Temp 98 F (36.7 C)   Resp 19   Ht 5' 5 (1.651 m)   Wt 121.1 kg   SpO2 96%   BMI 44.43 kg/m   Physical Exam Vitals and nursing note reviewed.  Constitutional:       General: She is not in acute distress.    Appearance: Normal appearance. She is well-developed. She is not ill-appearing.  HENT:     Head: Normocephalic and atraumatic.  Eyes:     Conjunctiva/sclera: Conjunctivae normal.  Cardiovascular:     Rate and Rhythm: Normal rate and regular rhythm.     Heart sounds: No murmur heard. Pulmonary:     Effort: Pulmonary effort is normal. No respiratory distress.     Comments: Decreased breath sounds in bilateral lower lobes  Abdominal:     Palpations: Abdomen is soft.     Tenderness: There is no abdominal tenderness.  Musculoskeletal:        General: No swelling.     Cervical back: Neck supple.     Right lower leg: Edema present.     Left lower leg: Edema present.  Skin:    General: Skin is warm and dry.     Capillary Refill: Capillary refill takes less than 2 seconds.  Neurological:     Mental Status: She is alert.  Psychiatric:        Mood and Affect: Mood normal.     (all labs ordered are listed, but only abnormal results are displayed) Labs Reviewed  BRAIN NATRIURETIC PEPTIDE - Abnormal; Notable for the following components:      Result Value  B Natriuretic Peptide 236.0 (*)    All other components within normal limits  CBC WITH DIFFERENTIAL/PLATELET - Abnormal; Notable for the following components:   RDW 19.3 (*)    Platelets 429 (*)    All other components within normal limits  COMPREHENSIVE METABOLIC PANEL WITH GFR - Abnormal; Notable for the following components:   Chloride 96 (*)    Creatinine, Ser 1.03 (*)    Calcium 8.6 (*)    Total Protein 6.4 (*)    Albumin 2.1 (*)    AST 199 (*)    ALT 63 (*)    Alkaline Phosphatase 758 (*)    Total Bilirubin 11.7 (*)    GFR, Estimated 60 (*)    All other components within normal limits  URINALYSIS, ROUTINE W REFLEX MICROSCOPIC - Abnormal; Notable for the following components:   Color, Urine AMBER (*)    Leukocytes,Ua MODERATE (*)    Bacteria, UA RARE (*)    All other  components within normal limits  TROPONIN I (HIGH SENSITIVITY) - Abnormal; Notable for the following components:   Troponin I (High Sensitivity) 18 (*)    All other components within normal limits  HIV ANTIBODY (ROUTINE TESTING W REFLEX)  TROPONIN I (HIGH SENSITIVITY)    EKG: EKG Interpretation Date/Time:  Thursday December 09 2023 12:35:47 EDT Ventricular Rate:  96 PR Interval:    QRS Duration:  125 QT Interval:  395 QTC Calculation: 500 R Axis:   -11  Text Interpretation: Atrial fibrillation IVCD, consider atypical RBBB Compared with previous EKG from 06/22/2017 Confirmed by Gennaro Bouchard ([id number]) on 12/09/2023 12:42:42 PM  Radiology: US  Abdomen Limited RUQ (LIVER/GB) Result Date: 12/09/2023 CLINICAL DATA:  Abnormal LFTs. EXAM: ULTRASOUND ABDOMEN LIMITED RIGHT UPPER QUADRANT COMPARISON:  Ultrasound dated 06/13/2011. FINDINGS: Gallbladder: Cholecystectomy. Common bile duct: Diameter: 9 mm Liver: The liver demonstrates a heterogeneous echogenicity and coarsened echotexture. There is irregularity of the liver contour in keeping with cirrhosis. Ill-defined hypoechoic lesion in the posterior right lobe of the liver measures 9.0 x 6.5 x 6.7 cm. Further evaluation with multiphasic CT or MRI recommended. There is reversal of flow in the main portal vein. Other: None. IMPRESSION: 1. Cholecystectomy. 2. Cirrhosis. Solid-appearing mass in the right lobe of the liver. Further evaluation with MRI without and with contrast recommended. 3. Hepatofugal flow in the main portal vein. Electronically Signed   By: Vanetta Chou M.D.   On: 12/09/2023 15:16   DG Chest 2 View Result Date: 12/09/2023 EXAM: 2 VIEW(S) XRAY OF THE CHEST 12/09/2023 11:53:00 AM COMPARISON: 06/13/2008 chest radiograph. CLINICAL HISTORY: SOB. Pt arrived via POV from home c/o bilateral lower extremity swelling for past couple of months. FINDINGS: LUNGS AND PLEURA: Diffuse interstitial opacities. No focal pulmonary opacity. No pleural  effusion. No pneumothorax. HEART AND MEDIASTINUM: No acute abnormality of the cardiac and mediastinal silhouettes. BONES AND SOFT TISSUES: No acute osseous abnormality. IMPRESSION: 1. Diffuse interstitial opacities, likely pulmonary edema. Electronically signed by: Limin Xu MD 12/09/2023 12:46 PM EDT RP Workstation: [id number]     Procedures   Medications Ordered in the ED  cefTRIAXone  (ROCEPHIN ) 1 g in sodium chloride  0.9 % 100 mL IVPB (0 g Intravenous Stopped 12/09/23 1406)  polyethylene glycol (MIRALAX  / GLYCOLAX ) packet 17 g (17 g Oral Given 12/09/23 1338)  sodium chloride  flush (NS) 0.9 % injection 3 mL (has no administration in time range)  sodium chloride  flush (NS) 0.9 % injection 3 mL (has no administration in time range)  sodium chloride  flush (NS)  0.9 % injection 3 mL (has no administration in time range)  0.9 %  sodium chloride  infusion (has no administration in time range)  acetaminophen  (TYLENOL ) tablet 650 mg (has no administration in time range)    Or  acetaminophen  (TYLENOL ) suppository 650 mg (has no administration in time range)  traZODone  (DESYREL ) tablet 50 mg (has no administration in time range)  polyethylene glycol (MIRALAX  / GLYCOLAX ) packet 17 g (has no administration in time range)  bisacodyl  (DULCOLAX) suppository 10 mg (has no administration in time range)  ondansetron  (ZOFRAN ) tablet 4 mg (has no administration in time range)    Or  ondansetron  (ZOFRAN ) injection 4 mg (has no administration in time range)  furosemide  (LASIX ) injection 20 mg (has no administration in time range)  heparin  injection 5,000 Units (has no administration in time range)  albuterol  (PROVENTIL ) (2.5 MG/3ML) 0.083% nebulizer solution 2.5 mg (has no administration in time range)  furosemide  (LASIX ) injection 40 mg (40 mg Intravenous Given 12/09/23 1305)                                    Medical Decision Making Patient here for leg swelling.  She is given IV Lasix .  Found to be hypoxic on  room air at 87 to 88%.  Was placed on 2 L nasal cannula.  She is found to be in A-fib with a controlled rate.  This is new for her.  BNP slightly elevated but labs otherwise fairly unremarkable.  Patient's case discussed with hospitalist and patient be admitted for further workup and management.  She is agreeable with the plan.  Problems Addressed: Acute hypoxic respiratory failure (HCC): undiagnosed new problem with uncertain prognosis Atrial fibrillation, unspecified type [facility]): undiagnosed new problem with uncertain prognosis Lower extremity edema: undiagnosed new problem with uncertain prognosis  Amount and/or Complexity of Data Reviewed External Data Reviewed: notes.    Details: Outpatient records reviewed and patient with recent cataract surgery in February 2025 Labs: ordered. Decision-making details documented in ED Course.    Details: Lab workup reviewed by me and BNP is slightly elevated, first troponin is negative but she does have some abnormal LFTs Radiology: ordered and independent interpretation performed. Decision-making details documented in ED Course.    Details: Imaging ordered and interpreted by me independently of radiology Chest X-ray: Shows no acute abnormality ECG/medicine tests: ordered and independent interpretation performed. Decision-making details documented in ED Course.    Details: EKG ordered and interpreted by me in the absence of cardiology and shows atrial fibrillation with controlled rate, no STEMI, no previous for comparison Discussion of management or test interpretation with external provider(s): Dr. Rendall -hospitalist-I spoke with him regarding the patient he accepted the patient for further workup and management  Risk OTC drugs. Prescription drug management. Drug therapy requiring intensive monitoring for toxicity. Decision regarding hospitalization. Risk Details: CRITICAL CARE Performed by: Duwaine LITTIE Fusi   Total critical care time: 30  minutes  Critical care time was exclusive of separately billable procedures and treating other patients.  Critical care was necessary to treat or prevent imminent or life-threatening deterioration.  Critical care was time spent personally by me on the following activities: development of treatment plan with patient and/or surrogate as well as nursing, discussions with consultants, evaluation of patient's response to treatment, examination of patient, obtaining history from patient or surrogate, ordering and performing treatments and interventions, ordering and review of laboratory studies, ordering  and review of radiographic studies, pulse oximetry and re-evaluation of patient's condition.   Critical Care Total time providing critical care: 30 minutes      Final diagnoses:  Acute hypoxic respiratory failure (HCC)  Lower extremity edema  Atrial fibrillation, unspecified type [facility])    ED Discharge Orders     None          Gennaro Duwaine CROME, DO 12/09/23 1551

## 2023-12-09 NOTE — Plan of Care (Signed)

## 2023-12-09 NOTE — Plan of Care (Signed)
  Problem: Clinical Measurements: Goal: Cardiovascular complication will be avoided Outcome: Progressing   Problem: Activity: Goal: Risk for activity intolerance will decrease Outcome: Progressing   Problem: Nutrition: Goal: Adequate nutrition will be maintained Outcome: Progressing   

## 2023-12-09 NOTE — H&P (Signed)
 History and Physical   Patient: [name] FMW:[id number] DOB: 13-Jun-1955 DOA: 12/09/2023 DOS: the patient was seen and examined on 12/09/2023 PCP: Lari Elspeth BRAVO, MD  Patient coming from: Home  Chief Complaint:  Chief Complaint  Patient presents with   Leg Swelling   HPI: [name] is a 68 y.o. female with medical history significant for morbid obesity, status postcholecystectomy back in 2013, history of fatty liver who presents to the ED with shortness of breath, bilateral lower extremity swelling for the last couple months, worse over the last couple days--patient had not seen a medical provider since before COVID-19 hit No fever  Or chills - --no sick contacts at home - Additional history obtained from patient's husband at bedside-- --no leg pains no pleuritic symptoms - In the ED patient found to have O2 sats of 87% on room air she was placed on 2 L of oxygen  No Nausea, Vomiting or Diarrhea - No productive cough, no chest pains - Liver ultrasound with status postcholecystectomy, cirrhosis and Solid-appearing mass in the right lobe of the liver.Further evaluation with MRI without and with contrast recommended. -Ill-defined hypoechoic lesion in the posterior right lobe of the liver measures 9.0 x 6.5 x 6.7 cm.   Hepatofugal flow in the main portal vein. - Chest x-ray suggestive of pulmonary edema - Troponin is 18, BNP 236 - CBC unremarkable except for platelets of 429 -- Creatinine 1.0 - Total bili 11.7 ALT 63 AST 199 albumin 2.1 total protein 6.4 - UA suggestive of UTI --EKG ---?? Afib (artifacts)--- on monitor appears sinus  Review of Systems: As mentioned in the history of present illness. All other systems reviewed and are negative. Past Medical History:  Diagnosis Date   Arthritis    Obesity    Vaginal atrophy 05/27/2015   Vaginal irritation 05/27/2015   Past Surgical History:  Procedure Laterality Date   CATARACT EXTRACTION W/PHACO Right 06/28/2017    Procedure: CATARACT EXTRACTION PHACO AND INTRAOCULAR LENS PLACEMENT (IOC);  Surgeon: Perley Hamilton, MD;  Location: [facility];  Service: Ophthalmology;  Laterality: Right;  CDE: 7.94   CHOLECYSTECTOMY  07/09/2011   Procedure: LAPAROSCOPIC CHOLECYSTECTOMY WITH INTRAOPERATIVE CHOLANGIOGRAM;  Surgeon: Camellia CHRISTELLA Blush, MD,FACS;  Location: [facility] OR;  Service: General;  Laterality: N/A;  laparoscopic cholecystectomy with cholangiogram   JOINT REPLACEMENT     BILATERAL    TOTAL KNEE ARTHROPLASTY  2011   Social History:  reports that she has never smoked. She has never been exposed to tobacco smoke. She has never used smokeless tobacco. She reports that she does not drink alcohol and does not use drugs.  Allergies  Allergen Reactions   Penicillins Rash    Family History  Problem Relation Age of Onset   Cancer Mother        lung   Diabetes Father    Heart attack Paternal Grandmother    Heart attack Paternal Grandfather    Diabetes Brother     Prior to Admission medications   Medication Sig Start Date End Date Taking? Authorizing Provider  acetaminophen  (TYLENOL ) 650 MG CR tablet Take 650 mg by mouth every 8 (eight) hours as needed for pain.   Yes [provider]  polyethylene glycol (MIRALAX  / GLYCOLAX ) packet Take 17 g by mouth daily.   Yes [provider]    Physical Exam: Vitals:   12/09/23 1143 12/09/23 1144 12/09/23 1145 12/09/23 1510  BP:   (!) 141/68 (!) 167/84  Pulse:   98 92  Resp:   20 19  Temp:   98.2 F (36.8 C) 98 F (36.7 C)  TempSrc:   Oral   SpO2:   94% 96%  Weight: 113.4 kg   121.1 kg  Height: 5' 4 (1.626 m) 5' 4 (1.626 m)  5' 5 (1.651 m)    Physical Exam  Gen:- Awake Alert, in no acute distress, morbidly obese HEENT:- [place].AT, +ve  sclera icterus Neck-Supple Neck,No JVD,.  Nose---[place] 2L/min Lungs-diminished breath sounds, faint bibasilar rales  CV- S1, S2 normal, irregular but not regularly regular Abd-  +ve B.Sounds, Abd Soft, No tenderness,  increased truncal adiposity Extremity/Skin:- 2 +ve edema,   good pedal pulses  Psych-affect is appropriate, oriented x3 Neuro-generalized weakness, no new focal deficits, no tremors  Data Reviewed: Liver ultrasound with status postcholecystectomy, cirrhosis and Solid-appearing mass in the right lobe of the liver.Further evaluation with MRI without and with contrast recommended. -Ill-defined hypoechoic lesion in the posterior right lobe of the liver measures 9.0 x 6.5 x 6.7 cm.   Hepatofugal flow in the main portal vein. - Chest x-ray suggestive of pulmonary edema - Troponin is 18, BNP 236 - CBC unremarkable except for platelets of 429 -- Creatinine 1.0 - Total bili 11.7 ALT 63 AST 199 albumin 2.1 total protein 6.4 - UA suggestive of UTI --EKG ---?? Afib (artifacts)--- on monitor appears sinus  Assessment and Plan: 1)Liver Cirrhosis with Solid-appearing mass in the right lobe of the liver MRI/MRCP pending--- -review of records indicate imaging evidence of liver cirrhosis from images back in February and March 2013 --History of fatty liver disease -Check acute Viral Hepatitis profile -Check fast lipid profile - Check AFP tumor marker and CEA - Patient apparently has never had a colonoscopy  2) acute hypoxic respiratory failure--noted #1 above with third spacing and lower extremity edema - Continue supplemental oxygen  3) CHF--??  Diastolic Versus systolic--- significant lower extremity edema  -Elevated BNP and chest x-ray with pulmonary edema noted --echo pending - IV Lasix  Strict I's and O's - Daily weights - TED stockings and elevate extremity  4) possible UTI--IV Rocephin  pending culture data   Advance Care Planning:   Code Status: Full Code   Family Communication: Called with husband at bedside  Severity of Illness: The appropriate patient status for this patient is INPATIENT. Inpatient status is judged to be reasonable and necessary in order to provide the required  intensity of service to ensure the patient's safety. The patient's presenting symptoms, physical exam findings, and initial radiographic and laboratory data in the context of their chronic comorbidities is felt to place them at high risk for further clinical deterioration. Furthermore, it is not anticipated that the patient will be medically stable for discharge from the hospital within 2 midnights of admission.   * I certify that at the point of admission it is my clinical judgment that the patient will require inpatient hospital care spanning beyond 2 midnights from the point of admission due to high intensity of service, high risk for further deterioration and high frequency of surveillance required.*  Author: Rendall Carwin, MD 12/09/2023 6:28 PM  For on call review [email or web address].

## 2023-12-09 NOTE — ED Triage Notes (Signed)
 Pt arrived via POV from home c/o bilateral lower extremity swelling for past couple of months. Pt reports painful swelling only decreases when she lays down and elevates her legs. Pt ambulatory in Triage.

## 2023-12-10 ENCOUNTER — Inpatient Hospital Stay (HOSPITAL_COMMUNITY)

## 2023-12-10 ENCOUNTER — Telehealth: Payer: Self-pay | Admitting: Gastroenterology

## 2023-12-10 DIAGNOSIS — E66813 Obesity, class 3: Secondary | ICD-10-CM

## 2023-12-10 DIAGNOSIS — I5021 Acute systolic (congestive) heart failure: Secondary | ICD-10-CM | POA: Diagnosis not present

## 2023-12-10 DIAGNOSIS — R160 Hepatomegaly, not elsewhere classified: Secondary | ICD-10-CM | POA: Diagnosis not present

## 2023-12-10 DIAGNOSIS — J9601 Acute respiratory failure with hypoxia: Secondary | ICD-10-CM | POA: Diagnosis not present

## 2023-12-10 LAB — COMPREHENSIVE METABOLIC PANEL WITH GFR
ALT: 54 U/L — ABNORMAL HIGH (ref 0–44)
AST: 181 U/L — ABNORMAL HIGH (ref 15–41)
Albumin: 1.8 g/dL — ABNORMAL LOW (ref 3.5–5.0)
Alkaline Phosphatase: 648 U/L — ABNORMAL HIGH (ref 38–126)
Anion gap: 16 — ABNORMAL HIGH (ref 5–15)
BUN: 18 mg/dL (ref 8–23)
CO2: 28 mmol/L (ref 22–32)
Calcium: 8.4 mg/dL — ABNORMAL LOW (ref 8.9–10.3)
Chloride: 94 mmol/L — ABNORMAL LOW (ref 98–111)
Creatinine, Ser: 0.95 mg/dL (ref 0.44–1.00)
GFR, Estimated: >60 mL/min (ref >60)
Glucose, Bld: 74 mg/dL (ref 70–99)
Potassium: 3.4 mmol/L — ABNORMAL LOW (ref 3.5–5.1)
Sodium: 138 mmol/L (ref 135–145)
Total Bilirubin: 10.7 mg/dL — ABNORMAL HIGH (ref 0.0–1.2)
Total Protein: 5.5 g/dL — ABNORMAL LOW (ref 6.5–8.1)

## 2023-12-10 LAB — LIPID PANEL
Cholesterol: 195 mg/dL (ref 0–200)
HDL: <10 mg/dL — ABNORMAL LOW (ref >40)
Triglycerides: 129 mg/dL (ref <150)
VLDL: 26 mg/dL (ref 0–40)

## 2023-12-10 LAB — PROTIME-INR
INR: 1.2 (ref 0.8–1.2)
Prothrombin Time: 15.4 s — ABNORMAL HIGH (ref 11.4–15.2)

## 2023-12-10 LAB — HEPATITIS PANEL, ACUTE
HCV Ab: NONREACTIVE
Hep A IgM: NONREACTIVE
Hep B C IgM: NONREACTIVE
Hepatitis B Surface Ag: NONREACTIVE

## 2023-12-10 LAB — HIV ANTIBODY (ROUTINE TESTING W REFLEX): HIV Screen 4th Generation wRfx: NONREACTIVE

## 2023-12-10 MED ORDER — SPIRONOLACTONE 25 MG PO TABS
25.0000 mg | ORAL_TABLET | Freq: Two times a day (BID) | ORAL | Status: DC
  Administered 2023-12-10 – 2023-12-11 (×2): 25 mg via ORAL
  Filled 2023-12-10 (×2): qty 1

## 2023-12-10 MED ORDER — POTASSIUM CHLORIDE CRYS ER 20 MEQ PO TBCR
40.0000 meq | EXTENDED_RELEASE_TABLET | ORAL | Status: AC
  Administered 2023-12-10 (×2): 40 meq via ORAL
  Filled 2023-12-10 (×2): qty 2

## 2023-12-10 MED ORDER — FUROSEMIDE 10 MG/ML IJ SOLN
20.0000 mg | Freq: Once | INTRAMUSCULAR | Status: DC
  Filled 2023-12-10: qty 2

## 2023-12-10 MED ORDER — GADOBUTROL 1 MMOL/ML IV SOLN
10.0000 mL | Freq: Once | INTRAVENOUS | Status: AC | PRN
  Administered 2023-12-10: 10 mL via INTRAVENOUS

## 2023-12-10 MED ORDER — POTASSIUM CHLORIDE CRYS ER 20 MEQ PO TBCR
40.0000 meq | EXTENDED_RELEASE_TABLET | Freq: Once | ORAL | Status: AC
  Administered 2023-12-10: 40 meq via ORAL
  Filled 2023-12-10: qty 2

## 2023-12-10 NOTE — Progress Notes (Signed)
 PROGRESS NOTE  [name], is a 68 y.o. female, DOB - [birth year], FMW:[id number]  Admit date - 12/09/2023   Admitting Physician Tracy Venturini Pearlean, MD  Outpatient Primary MD for the patient is Burdine, Elspeth BRAVO, MD  LOS - 1  Chief Complaint  Patient presents with   Leg Swelling      Brief Narrative:  68 y.o. 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  -review of records indicate imaging evidence of liver cirrhosis from images back in February and March 2013 --History of fatty liver disease -Acute Viral Hepatitis profile---Negative for hep A, hep B or hep C Fasting lipid profile noted with HDL less than 10 triglycerides 129 total cholesterol 195 VLDL 26 - AFP tumor marker and CEA pending - Patient apparently has never had a colonoscopy  -INR 1.2 - Elevated LFTs noted- MRI/MRCP findings noted--IR consulted for outpatient liver biopsy  2) acute hypoxic respiratory failure--noted #1 above with third spacing and lower extremity edema - Continue supplemental oxygen currently requiring 2 L via nasal cannula   3) CHF--??  Diastolic Versus systolic--- significant lower extremity edema  -Elevated BNP and chest x-ray with pulmonary edema noted --echo pending - c/n IV Lasix  and p.o. Aldactone  Strict I's and O's - Daily weights - TED stockings and elevate extremity   4) possible UTI--treat Rocephin , patient asymptomatic, no further antibiotics indicated  5)Morbid Obesity- -Low calorie diet, portion control and increase physical activity discussed with patient -Body mass index is 43.93 kg/m.   Status is: Inpatient   Disposition: The patient is from: Home              Anticipated d/c is to: Home               Anticipated d/c date is: 1 day              Patient currently is not medically stable to d/c. Barriers: Not Clinically Stable-   Code Status :  -  Code Status: Full Code   Family Communication:   (patient is alert, awake and coherent)  - Discussed with husband and nephew [name] at bedside  DVT Prophylaxis  :   - SCDs   Place and maintain sequential compression device Start: 12/10/23 1055 heparin  injection 5,000 Units Start: 12/09/23 2200 SCDs Start: 12/09/23 1318 Place TED hose Start: 12/09/23 1318   Lab Results  Component Value Date   PLT 429 (H) 12/09/2023    Inpatient Medications  Scheduled Meds:  furosemide   20 mg Intravenous Q12H   furosemide   20 mg Intravenous Once   heparin   5,000 Units Subcutaneous Q8H   polyethylene glycol  17 g Oral Daily   sodium chloride  flush  3 mL Intravenous Q12H   sodium chloride  flush  3 mL Intravenous Q12H   spironolactone   25 mg Oral Daily   Continuous Infusions:  cefTRIAXone  (ROCEPHIN )  IV 1 g (12/10/23 1303)   PRN Meds:.acetaminophen  **OR** acetaminophen , albuterol , bisacodyl , ondansetron  **OR** ondansetron  (ZOFRAN ) IV, mouth rinse, polyethylene glycol, sodium chloride  flush, traZODone    Anti-infectives (From admission, onward)    Start     Dose/Rate Route Frequency Ordered Stop   12/09/23 1330  cefTRIAXone  (ROCEPHIN ) 1 g in sodium chloride  0.9 % 100 mL IVPB  1 g 200 mL/hr over 30 Minutes Intravenous Every 24 hours 12/09/23 1311           Subjective: [name] today has no fevers, no emesis,  No chest pain,   Husband and nephew at bedside -Voiding well - Continues to require oxygen via nasal cannula   Objective: Vitals:   12/09/23 2004 12/10/23 0004 12/10/23 0547 12/10/23 1411  BP: (!) 120/46 (!) 134/56 (!) 154/67 (!) 120/54  Pulse: 86 85 89 97  Resp: 20     Temp: 97.9 F (36.6 C) 97.6 F (36.4 C) 98.4 F (36.9 C) 98.2 F (36.8 C)  TempSrc: Oral Oral Oral Oral  SpO2: 92% 93% 92% 96%  Weight:   119.7  kg   Height:        Intake/Output Summary (Last 24 hours) at 12/10/2023 1838 Last data filed at 12/10/2023 1806 Gross per 24 hour  Intake 480 ml  Output 3000 ml  Net -2520 ml   Filed Weights   12/09/23 1143 12/09/23 1510 12/10/23 0547  Weight: 113.4 kg 121.1 kg 119.7 kg    Physical Exam  Gen:- Awake Alert, morbidly obese, in no acute distress HEENT:- [place].AT, No sclera icterus Nose- [place] 2L/min Neck-Supple Neck, +ve JVD,.  Lungs-diminished in bases, faint bibasilar rales CV- S1, S2 normal, regular  Abd-  +ve B.Sounds, Abd Soft, No significant tenderness, increased truncal adiposity noted Extremity/Skin:- +ve  edema, pedal pulses present  Psych-affect is appropriate, oriented x3 Neuro-no new focal deficits, no tremors  Data Reviewed: I have personally reviewed following labs and imaging studies  CBC: Recent Labs  Lab 12/09/23 1214  WBC 6.9  NEUTROABS 4.4  HGB 12.6  HCT 38.7  MCV 93.7  PLT 429*   Basic Metabolic Panel: Recent Labs  Lab 12/09/23 1214 12/10/23 0526  NA 137 138  K 3.7 3.4*  CL 96* 94*  CO2 27 28  GLUCOSE 95 74  BUN 18 18  CREATININE 1.03* 0.95  CALCIUM 8.6* 8.4*   GFR: Estimated Creatinine Clearance: 74.5 mL/min (by C-G formula based on SCr of 0.95 mg/dL). Liver Function Tests: Recent Labs  Lab 12/09/23 1214 12/10/23 0526  AST 199* 181*  ALT 63* 54*  ALKPHOS 758* 648*  BILITOT 11.7* 10.7*  PROT 6.4* 5.5*  ALBUMIN 2.1* 1.8*   Radiology Studies: US  ASCITES (ABDOMEN LIMITED) Result Date: 12/10/2023 CLINICAL DATA:  Patient with new onset cirrhosis liver, lesions. Trace ascites noted on recent MRI, request for diagnostic paracentesis. EXAM: LIMITED ABDOMEN ULTRASOUND FOR ASCITES TECHNIQUE: Limited ultrasound survey for ascites was performed in all four abdominal quadrants. COMPARISON:  Ultrasound abdomen limited 12/09/2023, MRI abdomen with and without contrast 12/10/2023 FINDINGS: No ascites noted in right upper, right lower, left upper or left  lower quadrants. IMPRESSION: No ascites on limited ultrasound.  Paracentesis was not performed. Electronically Signed   By: [name] M.D.   On: 12/10/2023 14:11   MR ABDOMEN MRCP W WO CONTAST Result Date: 12/10/2023 CLINICAL DATA:  Gallbladder/biliary cancer, staging Liver Mass. EXAM: MRI ABDOMEN WITHOUT AND WITH CONTRAST (INCLUDING MRCP) TECHNIQUE: Multiplanar multisequence MR imaging of the abdomen was performed both before and after the administration of intravenous contrast. Heavily T2-weighted images of the biliary and pancreatic ducts were obtained, and three-dimensional MRCP images were rendered by post processing. CONTRAST:  10mL GADAVIST  GADOBUTROL  1 MMOL/ML IV SOLN COMPARISON:  Ultrasound abdomen from 12/09/2023. FINDINGS: Lower chest: Unremarkable MR appearance to the lung bases. No pleural effusion. No pericardial effusion. Normal heart size. Hepatobiliary: There  is extensive liver surface irregularity/nodularity, compatible with cirrhosis. There are innumerable T1 hyperintense and T2 hypointense masses throughout the liver. The largest such mass is in the right hepatic dome, segments 8/4 a measuring up to 6.7 x 8.7 cm (series 7, image 34). The second largest lesion is in the right hepatic lobe, subcapsular segments 6/7 measuring 6.3 x 7.8 cm (series 7, image 47). This lesion corresponds to the lesion described on the recent ultrasound. Please note the first set of postcontrast images are little delayed and there are no true arterial phase images. These lesions show moderate enhancement on the first/30 second phase images. No washout of contrast on the delayed/equilibrium phase images. The lesion exhibits smooth thin T2 hypointense rim which is nonenhancing on the arterial phase images but exhibits enhancement on the 3 minute delayed images. There is no diffusion restriction on high B value images. No micro or macroscopic intralesional fat. This lesions are nonspecific and differential diagnosis  includes but not limited to are multiple dysplastic nodules, multiple hepatic adenomas, metastases, etc. Considering the lack of true arterial phase and this lesions appear relatively hypoenhancing on the delayed/equilibrium phase images, multifocal HCC also remains in the differential diagnosis. Further evaluation with tissue sampling is recommended. Also short-term continued follow-up examination is recommended to document the growth pattern. The middle hepatic vein is visualized and exhibit no thrombosis. The left and right hepatic veins are only seen near the central portion/confluence and appears patent. There is diminutive portal vein which is otherwise patent. No intrahepatic or extrahepatic bile duct dilatation. No choledocholithiasis. The gallbladder is surgically absent. Pancreas: There are multiple, scattered 1 cm or smaller T2 hyperintense nonenhancing foci throughout the pancreas (marked with electronic arrow sign on series 29). The largest 2 adjacent lesions in the uncinate process measure 6 x 9 and 8 x 9 mm (series 34, images 13 and 15). 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 Follow-up examination is recommended in 2 years. The pancreas is otherwise within normal limits. No peripancreatic fat stranding. Main pancreatic duct is not dilated. Spleen: Spleen is enlarged measuring up to 6.9 x 14.5 cm orthogonally on coronal plane. No focal lesion. Adrenals/Urinary Tract: Unremarkable adrenal glands. No hydroureteronephrosis. No suspicious renal mass. Stomach/Bowel: Visualized portions within the abdomen are unremarkable. No disproportionate dilation of bowel loops. Vascular/Lymphatic: No pathologically enlarged lymph nodes identified. No abdominal aortic aneurysm demonstrated. There is trace ascites mainly in the perihepatic region. There are extensive venous collaterals in the left upper quadrant mainly surrounding the spleen and in  the left para-aortic region, including splenorenal shunts, compatible with sequela of portal hypertension. Other:  None. Musculoskeletal: No suspicious bone lesions identified. IMPRESSION: 1. There are innumerable liver lesions, as described above. These are nonspecific and differential diagnosis includes but not limited to are dysplastic nodules, hepatic adenomas, multifocal HCC, metastases, etc. Further evaluation with tissue sampling is recommended. 2. 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 Follow-up examination is recommended in 2 years. 3. There is cirrhotic liver configuration with stigmata of portal hypertension including splenomegaly and trace ascites. 4. Multiple other nonacute observations, as described above. Electronically Signed   By: Ree Molt M.D.   On: 12/10/2023 09:15   MR 3D Recon At Scanner Result Date: 12/10/2023 CLINICAL DATA:  Gallbladder/biliary cancer, staging Liver Mass. EXAM: MRI ABDOMEN WITHOUT AND WITH CONTRAST (INCLUDING MRCP) TECHNIQUE: Multiplanar multisequence MR imaging of  the abdomen was performed both before and after the administration of intravenous contrast. Heavily T2-weighted images of the biliary and pancreatic ducts were obtained, and three-dimensional MRCP images were rendered by post processing. CONTRAST:  10mL GADAVIST  GADOBUTROL  1 MMOL/ML IV SOLN COMPARISON:  Ultrasound abdomen from 12/09/2023. FINDINGS: Lower chest: Unremarkable MR appearance to the lung bases. No pleural effusion. No pericardial effusion. Normal heart size. Hepatobiliary: There is extensive liver surface irregularity/nodularity, compatible with cirrhosis. There are innumerable T1 hyperintense and T2 hypointense masses throughout the liver. The largest such mass is in the right hepatic dome, segments 8/4 a measuring up to 6.7 x 8.7 cm (series 7, image 34). The second largest lesion  is in the right hepatic lobe, subcapsular segments 6/7 measuring 6.3 x 7.8 cm (series 7, image 47). This lesion corresponds to the lesion described on the recent ultrasound. Please note the first set of postcontrast images are little delayed and there are no true arterial phase images. These lesions show moderate enhancement on the first/30 second phase images. No washout of contrast on the delayed/equilibrium phase images. The lesion exhibits smooth thin T2 hypointense rim which is nonenhancing on the arterial phase images but exhibits enhancement on the 3 minute delayed images. There is no diffusion restriction on high B value images. No micro or macroscopic intralesional fat. This lesions are nonspecific and differential diagnosis includes but not limited to are multiple dysplastic nodules, multiple hepatic adenomas, metastases, etc. Considering the lack of true arterial phase and this lesions appear relatively hypoenhancing on the delayed/equilibrium phase images, multifocal HCC also remains in the differential diagnosis. Further evaluation with tissue sampling is recommended. Also short-term continued follow-up examination is recommended to document the growth pattern. The middle hepatic vein is visualized and exhibit no thrombosis. The left and right hepatic veins are only seen near the central portion/confluence and appears patent. There is diminutive portal vein which is otherwise patent. No intrahepatic or extrahepatic bile duct dilatation. No choledocholithiasis. The gallbladder is surgically absent. Pancreas: There are multiple, scattered 1 cm or smaller T2 hyperintense nonenhancing foci throughout the pancreas (marked with electronic arrow sign on series 29). The largest 2 adjacent lesions in the uncinate process measure 6 x 9 and 8 x 9 mm (series 34, images 13 and 15). 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 Follow-up examination is  recommended in 2 years. The pancreas is otherwise within normal limits. No peripancreatic fat stranding. Main pancreatic duct is not dilated. Spleen: Spleen is enlarged measuring up to 6.9 x 14.5 cm orthogonally on coronal plane. No focal lesion. Adrenals/Urinary Tract: Unremarkable adrenal glands. No hydroureteronephrosis. No suspicious renal mass. Stomach/Bowel: Visualized portions within the abdomen are unremarkable. No disproportionate dilation of bowel loops. Vascular/Lymphatic: No pathologically enlarged lymph nodes identified. No abdominal aortic aneurysm demonstrated. There is trace ascites mainly in the perihepatic region. There are extensive venous collaterals in the left upper quadrant mainly surrounding the spleen and in the left para-aortic region, including splenorenal shunts, compatible with sequela of portal hypertension. Other:  None. Musculoskeletal: No suspicious bone lesions identified. IMPRESSION: 1. There are innumerable liver lesions, as described above. These are nonspecific and differential diagnosis includes but not limited to are dysplastic nodules, hepatic adenomas, multifocal HCC, metastases, etc. Further evaluation with tissue sampling is recommended. 2. 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 Follow-up examination is recommended in 2 years. 3. There  is cirrhotic liver configuration with stigmata of portal hypertension including splenomegaly and trace ascites. 4. Multiple other nonacute observations, as described above. Electronically Signed   By: Ree Molt M.D.   On: 12/10/2023 09:15   US  Abdomen Limited RUQ (LIVER/GB) Result Date: 12/09/2023 CLINICAL DATA:  Abnormal LFTs. EXAM: ULTRASOUND ABDOMEN LIMITED RIGHT UPPER QUADRANT COMPARISON:  Ultrasound dated 06/13/2011. FINDINGS: Gallbladder: Cholecystectomy. Common bile duct: Diameter: 9 mm Liver: The liver  demonstrates a heterogeneous echogenicity and coarsened echotexture. There is irregularity of the liver contour in keeping with cirrhosis. Ill-defined hypoechoic lesion in the posterior right lobe of the liver measures 9.0 x 6.5 x 6.7 cm. Further evaluation with multiphasic CT or MRI recommended. There is reversal of flow in the main portal vein. Other: None. IMPRESSION: 1. Cholecystectomy. 2. Cirrhosis. Solid-appearing mass in the right lobe of the liver. Further evaluation with MRI without and with contrast recommended. 3. Hepatofugal flow in the main portal vein. Electronically Signed   By: Vanetta Chou M.D.   On: 12/09/2023 15:16   DG Chest 2 View Result Date: 12/09/2023 EXAM: 2 VIEW(S) XRAY OF THE CHEST 12/09/2023 11:53:00 AM COMPARISON: 06/13/2008 chest radiograph. CLINICAL HISTORY: SOB. Pt arrived via POV from home c/o bilateral lower extremity swelling for past couple of months. FINDINGS: LUNGS AND PLEURA: Diffuse interstitial opacities. No focal pulmonary opacity. No pleural effusion. No pneumothorax. HEART AND MEDIASTINUM: No acute abnormality of the cardiac and mediastinal silhouettes. BONES AND SOFT TISSUES: No acute osseous abnormality. IMPRESSION: 1. Diffuse interstitial opacities, likely pulmonary edema. Electronically signed by: Limin Xu MD 12/09/2023 12:46 PM EDT RP Workstation: [id number]   Scheduled Meds:  furosemide   20 mg Intravenous Q12H   furosemide   20 mg Intravenous Once   heparin   5,000 Units Subcutaneous Q8H   polyethylene glycol  17 g Oral Daily   sodium chloride  flush  3 mL Intravenous Q12H   sodium chloride  flush  3 mL Intravenous Q12H   spironolactone   25 mg Oral Daily   Continuous Infusions:  cefTRIAXone  (ROCEPHIN )  IV 1 g (12/10/23 1303)     LOS: 1 day   Rendall Carwin M.D on 12/10/2023 at 6:38 PM  Go to [email or web address] - for contact info  [facility] - Office  [phone]  If 7PM-7AM, please contact night-coverage [email or web address] 12/10/2023, 6:38 PM

## 2023-12-10 NOTE — Plan of Care (Signed)
  Problem: Education: Goal: Knowledge of General Education information will improve Description: Including pain rating scale, medication(s)/side effects and non-pharmacologic comfort measures Outcome: Adequate for Discharge   Problem: Clinical Measurements: Goal: Ability to maintain clinical measurements within normal limits will improve Outcome: Adequate for Discharge Goal: Diagnostic test results will improve Outcome: Adequate for Discharge

## 2023-12-10 NOTE — Telephone Encounter (Signed)
 Please make patient a New patient visit with any MD Raynaldo, Eartha, or Ahmed)- hospital referral for cirrhosis with possible HCC.   Charmaine Melia, MSN, APRN, FNP-BC, AGACNP-BC [facility] at [facility]

## 2023-12-10 NOTE — Progress Notes (Signed)
 Limited abdominal US  of all 4 quadrants shows no ascites.  No paracentesis was performed. Imaging results discussed with patient.  Images are available for review under imaging section of Epic.  Please call with questions or concerns.  [name]

## 2023-12-10 NOTE — TOC CM/SW Note (Signed)
 Transition of Care [facility]) - Inpatient Brief Assessment   Patient Details  Name: [name] MRN: [id number] Date of Birth: June 21, 1955  [facility] of [facility]) CM/SW Contact:    Lucie Lunger, LCSWA Phone Number: 12/10/2023, 9:45 AM   Clinical Narrative: Transition of Care Department [facility]) has reviewed patient and no TOC needs have been identified at this time. We will continue to monitor patient advancement through interdiciplinary progression rounds. If new patient transition needs arise, please place a TOC consult.  Transition of Care Asessment: Insurance and Status: Insurance coverage has been reviewed Patient has primary care physician: Yes Home environment has been reviewed: From home Prior level of function:: Independent Prior/Current Home Services: No current home services Social Drivers of Health Review: SDOH reviewed no interventions necessary Readmission risk has been reviewed: Yes Transition of care needs: no transition of care needs at this time

## 2023-12-11 ENCOUNTER — Inpatient Hospital Stay (HOSPITAL_COMMUNITY)

## 2023-12-11 DIAGNOSIS — E66813 Obesity, class 3: Secondary | ICD-10-CM | POA: Diagnosis not present

## 2023-12-11 DIAGNOSIS — M7989 Other specified soft tissue disorders: Secondary | ICD-10-CM | POA: Diagnosis not present

## 2023-12-11 DIAGNOSIS — R0609 Other forms of dyspnea: Secondary | ICD-10-CM

## 2023-12-11 DIAGNOSIS — J9601 Acute respiratory failure with hypoxia: Secondary | ICD-10-CM | POA: Diagnosis not present

## 2023-12-11 DIAGNOSIS — I5022 Chronic systolic (congestive) heart failure: Secondary | ICD-10-CM | POA: Diagnosis not present

## 2023-12-11 LAB — ECHOCARDIOGRAM COMPLETE
AR max vel: 1.79 cm2
AV Area VTI: 1.69 cm2
AV Area mean vel: 1.9 cm2
AV Mean grad: 7.6 mmHg
AV Peak grad: 16.2 mmHg
Ao pk vel: 2.01 m/s
Area-P 1/2: 3.72 cm2
Height: 65 in
MV M vel: 5.52 m/s
MV Peak grad: 121.9 mmHg
Radius: 0.8 cm
S' Lateral: 2.2 cm
Weight: 4179.92 [oz_av]

## 2023-12-11 LAB — COMPREHENSIVE METABOLIC PANEL WITH GFR
ALT: 57 U/L — ABNORMAL HIGH (ref 0–44)
AST: 199 U/L — ABNORMAL HIGH (ref 15–41)
Albumin: 1.9 g/dL — ABNORMAL LOW (ref 3.5–5.0)
Alkaline Phosphatase: 658 U/L — ABNORMAL HIGH (ref 38–126)
Anion gap: 12 (ref 5–15)
BUN: 21 mg/dL (ref 8–23)
CO2: 30 mmol/L (ref 22–32)
Calcium: 8.4 mg/dL — ABNORMAL LOW (ref 8.9–10.3)
Chloride: 97 mmol/L — ABNORMAL LOW (ref 98–111)
Creatinine, Ser: 1.21 mg/dL — ABNORMAL HIGH (ref 0.44–1.00)
GFR, Estimated: 49 mL/min — ABNORMAL LOW (ref >60)
Glucose, Bld: 74 mg/dL (ref 70–99)
Potassium: 4.4 mmol/L (ref 3.5–5.1)
Sodium: 139 mmol/L (ref 135–145)
Total Bilirubin: 10.1 mg/dL — ABNORMAL HIGH (ref 0.0–1.2)
Total Protein: 5.4 g/dL — ABNORMAL LOW (ref 6.5–8.1)

## 2023-12-11 LAB — CEA: CEA: 3.2 ng/mL (ref 0.0–4.7)

## 2023-12-11 LAB — AFP TUMOR MARKER: AFP, Serum, Tumor Marker: 3.2 ng/mL (ref 0.0–9.2)

## 2023-12-11 MED ORDER — FUROSEMIDE 10 MG/ML IJ SOLN
20.0000 mg | Freq: Every day | INTRAMUSCULAR | Status: DC
  Administered 2023-12-12: 20 mg via INTRAVENOUS
  Filled 2023-12-11: qty 2

## 2023-12-11 MED ORDER — CARVEDILOL 3.125 MG PO TABS
6.2500 mg | ORAL_TABLET | Freq: Two times a day (BID) | ORAL | Status: DC
  Administered 2023-12-11 – 2023-12-12 (×3): 6.25 mg via ORAL
  Filled 2023-12-11 (×3): qty 2

## 2023-12-11 MED ORDER — SPIRONOLACTONE 25 MG PO TABS
25.0000 mg | ORAL_TABLET | Freq: Every day | ORAL | Status: DC
  Administered 2023-12-12: 25 mg via ORAL
  Filled 2023-12-11: qty 1

## 2023-12-11 NOTE — Plan of Care (Signed)

## 2023-12-11 NOTE — Progress Notes (Signed)
 PROGRESS NOTE  [name], is a 68 y.o. female, DOB - [birth year], FMW:[id number]  Admit date - 12/09/2023   Admitting Physician Nasim Garofano Pearlean, MD  Outpatient Primary MD for the patient is Burdine, Elspeth BRAVO, MD  LOS - 2  Chief Complaint  Patient presents with   Leg Swelling      Brief Narrative:  68 y.o. 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  -review of records indicate imaging evidence of liver cirrhosis from images back in February and March 2013 --History of fatty liver disease -Acute Viral Hepatitis profile---Negative for hep A, hep B or hep C Fasting lipid profile noted with HDL less than 10 triglycerides 129 total cholesterol 195 VLDL 26 - AFP tumor marker 3.2 (not elevated) -CEA is 3.2 (not elevated) - Patient apparently has never had a colonoscopy  -INR 1.2 - Elevated LFTs noted- MRI/MRCP findings noted with innumerable liver lesions --IR consulted for outpatient liver biopsy--tentatively being scheduled for 12/20/2023 -- Insufficient ascitic fluid for paracentesis  2) acute hypoxic respiratory failure--noted #1 above with third spacing and lower extremity edema - Continue supplemental oxygen currently requiring 2 L via nasal cannula   3)volume overload secondary to liver disease as above #1 --- significant lower extremity edema  --No significant Dempsey CHF findings -Elevated BNP and chest x-ray with pulmonary edema noted --echo from 12/11/2023 with EF of 70 to 75%, no regional wall motion maladies, no diastolic dysfunction, no aortic stenosis, no mitral stenosis - Creatinine is up to 1.21 from 0.95  - IV Lasix  and oral Aldactone  adjusted   Strict I's and O's - Daily weights-- 267 >>264  >>261 -Patient reluctant to use TED stockings - Elevate extremity   4) possible UTI--treat Rocephin , patient asymptomatic, No further antibiotics indicated  5)Morbid Obesity- -Low calorie diet, portion control and increase physical activity discussed with patient -Body mass index is 43.47 kg/m.   Status is: Inpatient   Disposition: The patient is from: Home              Anticipated d/c is to: Home              Anticipated d/c date is: 1 day              Patient currently is not medically stable to d/c. Barriers: Not Clinically Stable- --Possible discharge home over the next 24 hours if renal function stabilizes and able to wean off oxygen  Code Status :  -  Code Status: Full Code   Family Communication:   (patient is alert, awake and coherent)  - Discussed with husband and nephew Ron at bedside  DVT Prophylaxis  :   - SCDs   Place and maintain sequential compression device Start: 12/10/23 1055 heparin  injection 5,000 Units Start: 12/09/23 2200 SCDs Start: 12/09/23 1318 Place TED hose Start: 12/09/23 1318   Lab Results  Component Value Date   PLT 429 (H) 12/09/2023   Inpatient Medications  Scheduled Meds:  carvedilol   6.25 mg Oral BID WC   [START ON 12/12/2023] furosemide   20 mg Intravenous Daily   heparin   5,000 Units Subcutaneous Q8H   polyethylene glycol  17 g Oral Daily   sodium chloride  flush  3 mL Intravenous Q12H   sodium chloride  flush  3 mL Intravenous Q12H   [START ON 12/12/2023] spironolactone   25 mg Oral Daily   Continuous Infusions:  PRN Meds:.acetaminophen  **OR** acetaminophen , albuterol , bisacodyl , ondansetron  **OR** ondansetron  (ZOFRAN ) IV, mouth rinse, polyethylene glycol, sodium chloride  flush, traZODone    Anti-infectives (From admission, onward)    Start     Dose/Rate Route Frequency Ordered Stop   12/09/23 1330  cefTRIAXone  (ROCEPHIN ) 1 g in sodium chloride  0.9 % 100 mL IVPB  Status:  Discontinued        1 g 200 mL/hr over 30 Minutes  Intravenous Every 24 hours 12/09/23 1311 12/10/23 1839      Subjective: [name] today has no fevers, no emesis,  No chest pain,   - Dyspnea exertion improved -Husband at bedside, questions answered - unable  to wean off O2 at this time -- Possible discharge home over the next 24 hours if renal function stabilizes and able to wean off oxygen  Objective: Vitals:   12/10/23 1938 12/11/23 0500 12/11/23 0600 12/11/23 1500  BP: (!) 124/55  (!) 143/88 138/79  Pulse: 90  87 78  Resp: 20  19   Temp: 98.9 F (37.2 C)  98.6 F (37 C) 98.2 F (36.8 C)  TempSrc: Oral  Oral Oral  SpO2: 94%  94% 95%  Weight:  118.5 kg    Height:        Intake/Output Summary (Last 24 hours) at 12/11/2023 1722 Last data filed at 12/11/2023 1533 Gross per 24 hour  Intake 720 ml  Output 1200 ml  Net -480 ml   Filed Weights   12/09/23 1510 12/10/23 0547 12/11/23 0500  Weight: 121.1 kg 119.7 kg 118.5 kg    Physical Exam  Gen:- Awake Alert, morbidly obese, in no acute distress HEENT:- [place].AT, No sclera icterus Nose- [place] 2L/min Neck-Supple Neck, +ve JVD,.  Lungs-improving air movement, no wheezing  CV- S1, S2 normal, regular  Abd-  +ve B.Sounds, Abd Soft, No significant tenderness, increased truncal adiposity noted Extremity/Skin:-Improving pitting  edema, pedal pulses present  Psych-affect is appropriate, oriented x3 Neuro-no new focal deficits, no tremors  Data Reviewed: I have personally reviewed following labs and imaging studies  CBC: Recent Labs  Lab 12/09/23 1214  WBC 6.9  NEUTROABS 4.4  HGB 12.6  HCT 38.7  MCV 93.7  PLT 429*   Basic Metabolic Panel: Recent Labs  Lab 12/09/23 1214 12/10/23 0526 12/11/23 0431  NA 137 138 139  K 3.7 3.4* 4.4  CL 96* 94* 97*  CO2 27 28 30   GLUCOSE 95 74 74  BUN 18 18 21   CREATININE 1.03* 0.95 1.21*  CALCIUM 8.6* 8.4* 8.4*   GFR: Estimated Creatinine Clearance: 58.1 mL/min (A) (by C-G formula based on SCr of 1.21 mg/dL (H)). Liver  Function Tests: Recent Labs  Lab 12/09/23 1214 12/10/23 0526 12/11/23 0431  AST 199* 181* 199*  ALT 63* 54* 57*  ALKPHOS 758* 648* 658*  BILITOT 11.7* 10.7* 10.1*  PROT 6.4* 5.5* 5.4*  ALBUMIN 2.1* 1.8* 1.9*   Radiology Studies: ECHOCARDIOGRAM COMPLETE Result Date: 12/11/2023    ECHOCARDIOGRAM REPORT   Patient Name:   [name] Date of Exam: 12/11/2023 Medical Rec #:  [id number]     Height:       65.0 in Accession #:    [id number]    Weight:       264.0 lb Date of Birth:  1955/07/18     BSA:          2.226 m Patient Age:  68 years      BP:           120/46 mmHg Patient Gender: F             HR:           92 bpm. Exam Location:  [facility] Procedure: 2D Echo, 3D Echo, Color Doppler, Cardiac Doppler and Strain Analysis            (Both Spectral and Color Flow Doppler were utilized during            procedure). Indications:    Dyspnea  History:        Patient has no prior history of Echocardiogram examinations.                 CHF; Risk Factors:Non-Smoker. Liver cirrhosis.  Sonographer:    Orvil Holmes RDCS Referring Phys: [id number] Gesenia Bantz IMPRESSIONS  1. Left ventricular ejection fraction, by estimation, is 70 to 75%. The left ventricle has hyperdynamic function. The left ventricle has no regional wall motion abnormalities. Left ventricular diastolic parameters were normal.  2. Right ventricular systolic function is normal. The right ventricular size is normal. Tricuspid regurgitation signal is inadequate for assessing PA pressure.  3. The mitral valve is abnormal. Mild mitral valve regurgitation. No evidence of mitral stenosis.  4. The aortic valve has an indeterminant number of cusps. Aortic valve regurgitation is not visualized. No aortic stenosis is present.  5. Aortic dilatation noted. There is mild dilatation of the ascending aorta, measuring 36 mm.  6. The inferior vena cava is normal in size with greater than 50% respiratory variability, suggesting right atrial pressure of 3 mmHg.  FINDINGS  Left Ventricle: Left ventricular ejection fraction, by estimation, is 70 to 75%. The left ventricle has hyperdynamic function. The left ventricle has no regional wall motion abnormalities. Strain was performed and the global longitudinal strain is indeterminate. The left ventricular internal cavity size was normal in size. There is no left ventricular hypertrophy. Left ventricular diastolic parameters were normal. Right Ventricle: The right ventricular size is normal. Right vetricular wall thickness was not well visualized. Right ventricular systolic function is normal. Tricuspid regurgitation signal is inadequate for assessing PA pressure. Left Atrium: Left atrial size was normal in size. Right Atrium: Right atrial size was normal in size. Pericardium: There is no evidence of pericardial effusion. Mitral Valve: The mitral valve is abnormal. Mild mitral valve regurgitation. No evidence of mitral valve stenosis. Tricuspid Valve: The tricuspid valve is normal in structure. Tricuspid valve regurgitation is trivial. No evidence of tricuspid stenosis. Aortic Valve: The aortic valve has an indeterminant number of cusps. Aortic valve regurgitation is not visualized. No aortic stenosis is present. Aortic valve mean gradient measures 7.6 mmHg. Aortic valve peak gradient measures 16.2 mmHg. Aortic valve area, by VTI measures 1.69 cm. Pulmonic Valve: The pulmonic valve was not well visualized. Pulmonic valve regurgitation is not visualized. No evidence of pulmonic stenosis. Aorta: The aortic root is normal in size and structure and aortic dilatation noted. There is mild dilatation of the ascending aorta, measuring 36 mm. Venous: The inferior vena cava is normal in size with greater than 50% respiratory variability, suggesting right atrial pressure of 3 mmHg. IAS/Shunts: No atrial level shunt detected by color flow Doppler. Additional Comments: 3D was performed not requiring image post processing on an independent  workstation and was normal.  LEFT VENTRICLE PLAX 2D LVIDd:         4.50 cm   Diastology  LVIDs:         2.20 cm   LV e' medial:    10.00 cm/s LV PW:         0.90 cm   LV E/e' medial:  12.6 LV IVS:        0.90 cm   LV e' lateral:   10.70 cm/s LVOT diam:     2.00 cm   LV E/e' lateral: 11.8 LV SV:         72 LV SV Index:   32 LVOT Area:     3.14 cm                           3D Volume EF:                          3D EF:        67 %                          LV EDV:       145 ml                          LV ESV:       48 ml                          LV SV:        97 ml RIGHT VENTRICLE RV Basal diam:  4.10 cm RV Mid diam:    3.40 cm RV S prime:     14.70 cm/s TAPSE (M-mode): 3.2 cm LEFT ATRIUM             Index        RIGHT ATRIUM           Index LA diam:        3.90 cm 1.75 cm/m   RA Area:     19.30 cm LA Vol (A2C):   75.7 ml 34.01 ml/m  RA Volume:   58.40 ml  26.24 ml/m LA Vol (A4C):   70.7 ml 31.76 ml/m LA Biplane Vol: 77.8 ml 34.95 ml/m  AORTIC VALVE AV Area (Vmax):    1.79 cm AV Area (Vmean):   1.90 cm AV Area (VTI):     1.69 cm AV Vmax:           201.41 cm/s AV Vmean:          127.841 cm/s AV VTI:            0.423 m AV Peak Grad:      16.2 mmHg AV Mean Grad:      7.6 mmHg LVOT Vmax:         115.00 cm/s LVOT Vmean:        77.400 cm/s LVOT VTI:          0.228 m LVOT/AV VTI ratio: 0.54  AORTA Ao Root diam: 3.00 cm Ao Asc diam:  3.60 cm MITRAL VALVE MV Area (PHT): 3.72 cm       SHUNTS MV Decel Time: 204 msec       Systemic VTI:  0.23 m MR Peak grad:    121.9 mmHg   Systemic Diam: 2.00 cm MR Mean grad:    98.0 mmHg MR Vmax:         552.00 cm/s MR Vmean:  480.0 cm/s MR PISA:         4.02 cm MR PISA Eff ROA: 23 mm MR PISA Radius:  0.80 cm MV E velocity: 126.00 cm/s MV A velocity: 79.00 cm/s MV E/A ratio:  1.59 Dorn Ross MD Electronically signed by Dorn Ross MD Signature Date/Time: 12/11/2023/2:04:18 PM    Final    US  ASCITES (ABDOMEN LIMITED) Result Date: 12/10/2023 CLINICAL DATA:  Patient with  new onset cirrhosis liver, lesions. Trace ascites noted on recent MRI, request for diagnostic paracentesis. EXAM: LIMITED ABDOMEN ULTRASOUND FOR ASCITES TECHNIQUE: Limited ultrasound survey for ascites was performed in all four abdominal quadrants. COMPARISON:  Ultrasound abdomen limited 12/09/2023, MRI abdomen with and without contrast 12/10/2023 FINDINGS: No ascites noted in right upper, right lower, left upper or left lower quadrants. IMPRESSION: No ascites on limited ultrasound.  Paracentesis was not performed. Electronically Signed   By: Ester Sides M.D.   On: 12/10/2023 14:11   MR ABDOMEN MRCP W WO CONTAST Result Date: 12/10/2023 CLINICAL DATA:  Gallbladder/biliary cancer, staging Liver Mass. EXAM: MRI ABDOMEN WITHOUT AND WITH CONTRAST (INCLUDING MRCP) TECHNIQUE: Multiplanar multisequence MR imaging of the abdomen was performed both before and after the administration of intravenous contrast. Heavily T2-weighted images of the biliary and pancreatic ducts were obtained, and three-dimensional MRCP images were rendered by post processing. CONTRAST:  10mL GADAVIST  GADOBUTROL  1 MMOL/ML IV SOLN COMPARISON:  Ultrasound abdomen from 12/09/2023. FINDINGS: Lower chest: Unremarkable MR appearance to the lung bases. No pleural effusion. No pericardial effusion. Normal heart size. Hepatobiliary: There is extensive liver surface irregularity/nodularity, compatible with cirrhosis. There are innumerable T1 hyperintense and T2 hypointense masses throughout the liver. The largest such mass is in the right hepatic dome, segments 8/4 a measuring up to 6.7 x 8.7 cm (series 7, image 34). The second largest lesion is in the right hepatic lobe, subcapsular segments 6/7 measuring 6.3 x 7.8 cm (series 7, image 47). This lesion corresponds to the lesion described on the recent ultrasound. Please note the first set of postcontrast images are little delayed and there are no true arterial phase images. These lesions show moderate  enhancement on the first/30 second phase images. No washout of contrast on the delayed/equilibrium phase images. The lesion exhibits smooth thin T2 hypointense rim which is nonenhancing on the arterial phase images but exhibits enhancement on the 3 minute delayed images. There is no diffusion restriction on high B value images. No micro or macroscopic intralesional fat. This lesions are nonspecific and differential diagnosis includes but not limited to are multiple dysplastic nodules, multiple hepatic adenomas, metastases, etc. Considering the lack of true arterial phase and this lesions appear relatively hypoenhancing on the delayed/equilibrium phase images, multifocal HCC also remains in the differential diagnosis. Further evaluation with tissue sampling is recommended. Also short-term continued follow-up examination is recommended to document the growth pattern. The middle hepatic vein is visualized and exhibit no thrombosis. The left and right hepatic veins are only seen near the central portion/confluence and appears patent. There is diminutive portal vein which is otherwise patent. No intrahepatic or extrahepatic bile duct dilatation. No choledocholithiasis. The gallbladder is surgically absent. Pancreas: There are multiple, scattered 1 cm or smaller T2 hyperintense nonenhancing foci throughout the pancreas (marked with electronic arrow sign on series 29). The largest 2 adjacent lesions in the uncinate process measure 6 x 9 and 8 x 9 mm (series 34, images 13 and 15). 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  Follow-up examination is recommended in 2 years. The pancreas is otherwise within normal limits. No peripancreatic fat stranding. Main pancreatic duct is not dilated. Spleen: Spleen is enlarged measuring up to 6.9 x 14.5 cm orthogonally on coronal plane. No focal lesion. Adrenals/Urinary Tract: Unremarkable adrenal glands. No  hydroureteronephrosis. No suspicious renal mass. Stomach/Bowel: Visualized portions within the abdomen are unremarkable. No disproportionate dilation of bowel loops. Vascular/Lymphatic: No pathologically enlarged lymph nodes identified. No abdominal aortic aneurysm demonstrated. There is trace ascites mainly in the perihepatic region. There are extensive venous collaterals in the left upper quadrant mainly surrounding the spleen and in the left para-aortic region, including splenorenal shunts, compatible with sequela of portal hypertension. Other:  None. Musculoskeletal: No suspicious bone lesions identified. IMPRESSION: 1. There are innumerable liver lesions, as described above. These are nonspecific and differential diagnosis includes but not limited to are dysplastic nodules, hepatic adenomas, multifocal HCC, metastases, etc. Further evaluation with tissue sampling is recommended. 2. 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 Follow-up examination is recommended in 2 years. 3. There is cirrhotic liver configuration with stigmata of portal hypertension including splenomegaly and trace ascites. 4. Multiple other nonacute observations, as described above. Electronically Signed   By: Ree Molt M.D.   On: 12/10/2023 09:15   MR 3D Recon At Scanner Result Date: 12/10/2023 CLINICAL DATA:  Gallbladder/biliary cancer, staging Liver Mass. EXAM: MRI ABDOMEN WITHOUT AND WITH CONTRAST (INCLUDING MRCP) TECHNIQUE: Multiplanar multisequence MR imaging of the abdomen was performed both before and after the administration of intravenous contrast. Heavily T2-weighted images of the biliary and pancreatic ducts were obtained, and three-dimensional MRCP images were rendered by post processing. CONTRAST:  10mL GADAVIST  GADOBUTROL  1 MMOL/ML IV SOLN COMPARISON:  Ultrasound abdomen from 12/09/2023. FINDINGS: Lower chest:  Unremarkable MR appearance to the lung bases. No pleural effusion. No pericardial effusion. Normal heart size. Hepatobiliary: There is extensive liver surface irregularity/nodularity, compatible with cirrhosis. There are innumerable T1 hyperintense and T2 hypointense masses throughout the liver. The largest such mass is in the right hepatic dome, segments 8/4 a measuring up to 6.7 x 8.7 cm (series 7, image 34). The second largest lesion is in the right hepatic lobe, subcapsular segments 6/7 measuring 6.3 x 7.8 cm (series 7, image 47). This lesion corresponds to the lesion described on the recent ultrasound. Please note the first set of postcontrast images are little delayed and there are no true arterial phase images. These lesions show moderate enhancement on the first/30 second phase images. No washout of contrast on the delayed/equilibrium phase images. The lesion exhibits smooth thin T2 hypointense rim which is nonenhancing on the arterial phase images but exhibits enhancement on the 3 minute delayed images. There is no diffusion restriction on high B value images. No micro or macroscopic intralesional fat. This lesions are nonspecific and differential diagnosis includes but not limited to are multiple dysplastic nodules, multiple hepatic adenomas, metastases, etc. Considering the lack of true arterial phase and this lesions appear relatively hypoenhancing on the delayed/equilibrium phase images, multifocal HCC also remains in the differential diagnosis. Further evaluation with tissue sampling is recommended. Also short-term continued follow-up examination is recommended to document the growth pattern. The middle hepatic vein is visualized and exhibit no thrombosis. The left and right hepatic veins are only seen near the central portion/confluence and appears patent. There is diminutive portal vein which is otherwise patent. No intrahepatic or extrahepatic bile duct dilatation. No  choledocholithiasis. The  gallbladder is surgically absent. Pancreas: There are multiple, scattered 1 cm or smaller T2 hyperintense nonenhancing foci throughout the pancreas (marked with electronic arrow sign on series 29). The largest 2 adjacent lesions in the uncinate process measure 6 x 9 and 8 x 9 mm (series 34, images 13 and 15). 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 Follow-up examination is recommended in 2 years. The pancreas is otherwise within normal limits. No peripancreatic fat stranding. Main pancreatic duct is not dilated. Spleen: Spleen is enlarged measuring up to 6.9 x 14.5 cm orthogonally on coronal plane. No focal lesion. Adrenals/Urinary Tract: Unremarkable adrenal glands. No hydroureteronephrosis. No suspicious renal mass. Stomach/Bowel: Visualized portions within the abdomen are unremarkable. No disproportionate dilation of bowel loops. Vascular/Lymphatic: No pathologically enlarged lymph nodes identified. No abdominal aortic aneurysm demonstrated. There is trace ascites mainly in the perihepatic region. There are extensive venous collaterals in the left upper quadrant mainly surrounding the spleen and in the left para-aortic region, including splenorenal shunts, compatible with sequela of portal hypertension. Other:  None. Musculoskeletal: No suspicious bone lesions identified. IMPRESSION: 1. There are innumerable liver lesions, as described above. These are nonspecific and differential diagnosis includes but not limited to are dysplastic nodules, hepatic adenomas, multifocal HCC, metastases, etc. Further evaluation with tissue sampling is recommended. 2. 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 Follow-up examination is recommended in 2 years. 3. There is cirrhotic liver configuration with stigmata of portal hypertension including  splenomegaly and trace ascites. 4. Multiple other nonacute observations, as described above. Electronically Signed   By: Ree Molt M.D.   On: 12/10/2023 09:15   Scheduled Meds:  carvedilol   6.25 mg Oral BID WC   [START ON 12/12/2023] furosemide   20 mg Intravenous Daily   heparin   5,000 Units Subcutaneous Q8H   polyethylene glycol  17 g Oral Daily   sodium chloride  flush  3 mL Intravenous Q12H   sodium chloride  flush  3 mL Intravenous Q12H   [START ON 12/12/2023] spironolactone   25 mg Oral Daily   Continuous Infusions:   LOS: 2 days   Rendall Carwin M.D on 12/11/2023 at 5:22 PM  Go to [email or web address] - for contact info  [facility] - Office  [phone]  If 7PM-7AM, please contact night-coverage [email or web address] 12/11/2023, 5:22 PM

## 2023-12-12 DIAGNOSIS — I5032 Chronic diastolic (congestive) heart failure: Secondary | ICD-10-CM

## 2023-12-12 DIAGNOSIS — J9601 Acute respiratory failure with hypoxia: Secondary | ICD-10-CM | POA: Diagnosis not present

## 2023-12-12 DIAGNOSIS — E66813 Obesity, class 3: Secondary | ICD-10-CM | POA: Diagnosis not present

## 2023-12-12 DIAGNOSIS — R160 Hepatomegaly, not elsewhere classified: Secondary | ICD-10-CM | POA: Diagnosis not present

## 2023-12-12 LAB — COMPREHENSIVE METABOLIC PANEL WITH GFR
ALT: 59 U/L — ABNORMAL HIGH (ref 0–44)
AST: 218 U/L — ABNORMAL HIGH (ref 15–41)
Albumin: 1.8 g/dL — ABNORMAL LOW (ref 3.5–5.0)
Alkaline Phosphatase: 622 U/L — ABNORMAL HIGH (ref 38–126)
Anion gap: 12 (ref 5–15)
BUN: 25 mg/dL — ABNORMAL HIGH (ref 8–23)
CO2: 29 mmol/L (ref 22–32)
Calcium: 8.2 mg/dL — ABNORMAL LOW (ref 8.9–10.3)
Chloride: 92 mmol/L — ABNORMAL LOW (ref 98–111)
Creatinine, Ser: 1.15 mg/dL — ABNORMAL HIGH (ref 0.44–1.00)
GFR, Estimated: 52 mL/min — ABNORMAL LOW (ref >60)
Glucose, Bld: 87 mg/dL (ref 70–99)
Potassium: 3.9 mmol/L (ref 3.5–5.1)
Sodium: 133 mmol/L — ABNORMAL LOW (ref 135–145)
Total Bilirubin: 10.9 mg/dL — ABNORMAL HIGH (ref 0.0–1.2)
Total Protein: 5.3 g/dL — ABNORMAL LOW (ref 6.5–8.1)

## 2023-12-12 MED ORDER — LACTULOSE 10 GM/15ML PO SOLN: 10.0000 g | Freq: Every day | ORAL | 4 refills | Status: AC

## 2023-12-12 MED ORDER — CARVEDILOL 3.125 MG PO TABS
3.1250 mg | ORAL_TABLET | Freq: Two times a day (BID) | ORAL | 11 refills | Status: DC
Start: 2023-12-12 — End: 2024-01-25

## 2023-12-12 MED ORDER — FUROSEMIDE 40 MG PO TABS: 40.0000 mg | ORAL_TABLET | Freq: Every day | ORAL | 11 refills | Status: AC

## 2023-12-12 MED ORDER — SPIRONOLACTONE 25 MG PO TABS
25.0000 mg | ORAL_TABLET | Freq: Every day | ORAL | 11 refills | Status: AC
Start: 2023-12-12 — End: 2024-12-11

## 2023-12-12 NOTE — Progress Notes (Signed)
 REDs clip attempted x3. Low quality each time.

## 2023-12-12 NOTE — Plan of Care (Signed)

## 2023-12-12 NOTE — Discharge Summary (Signed)
[name], is a 68 y.o. female  DOB 1955/05/12  MRN [id number].  Admission date:  12/09/2023  Admitting Physician  Rendall Carwin, MD  Discharge Date:  12/12/2023   Primary MD  Lari Elspeth BRAVO, MD  Recommendations for primary care physician for things to follow:  1)Very Low-salt diet advised---Less than 2 gm of Sodium per day advised----ok to use Mrs DASH salt substitute instead of Salt 2)Weigh yourself daily, call if you gain more than 3 pounds in 1 day or more than 5 pounds in 1 week as your diuretic medications may need to be adjusted 3)Limit your Fluid  intake to no more than 60 ounces (1.8 Liters) per day 4)Avoid ibuprofen/Advil/Aleve/Motrin/Goody Powders/Naproxen/BC powders/Meloxicam/Diclofenac/Indomethacin and other Nonsteroidal anti-inflammatory medications as these will make you more likely to bleed and can cause stomach ulcers, can also cause Kidney problems.  5)Repeat CBC and CMP Blood Tests Every Wednesday x 3 weeks ---starting 12/15/23 6)Please Follow up with Gastroenterologist Dr. Eartha or Dr. Hobson evaluation of your liver cirrhosis within 1 to 2 weeks -address [address], [place] [address],,[place] Number [phone]   7)You need oxygen at home at 2 L via nasal cannula continuously while awake and while asleep--- smoking or having open fires around oxygen can cause fire, significant injury and death 8)You should get a call from interventional radiology in [place] to schedule your liver biopsy test--- which is Tentatively scheduled for Monday 12/20/2023 if you do Not get a call by Wednesday, 12/15/2023 please call [phone]  Admission Diagnosis  Lower extremity edema [R60.0] Acute exacerbation of CHF (congestive heart failure) (HCC) [I50.9] Atrial fibrillation, unspecified type (HCC) [I48.91] Acute hypoxic respiratory failure (HCC)  [J96.01]   Discharge Diagnosis  Lower extremity edema [R60.0] Acute exacerbation of CHF (congestive heart failure) (HCC) [I50.9] Atrial fibrillation, unspecified type (HCC) [I48.91] Acute hypoxic respiratory failure (HCC) [J96.01]    Principal Problem:   CHF (congestive heart failure) (HCC) Active Problems:   Liver cirrhosis secondary to NASH (HCC)   Acute respiratory failure with hypoxia (HCC)   Liver mass, right lobe   Obesity, Class III, BMI 40-49.9 (morbid obesity)   Leg swelling      Past Medical History:  Diagnosis Date   Arthritis    Obesity    Vaginal atrophy 05/27/2015   Vaginal irritation 05/27/2015    Past Surgical History:  Procedure Laterality Date   CATARACT EXTRACTION W/PHACO Right 06/28/2017   Procedure: CATARACT EXTRACTION PHACO AND INTRAOCULAR LENS PLACEMENT (IOC);  Surgeon: Perley Hamilton, MD;  Location: [facility];  Service: Ophthalmology;  Laterality: Right;  CDE: 7.94   CHOLECYSTECTOMY  07/09/2011   Procedure: LAPAROSCOPIC CHOLECYSTECTOMY WITH INTRAOPERATIVE CHOLANGIOGRAM;  Surgeon: Camellia CHRISTELLA Blush, MD,FACS;  Location: [facility];  Service: General;  Laterality: N/A;  laparoscopic cholecystectomy with cholangiogram   JOINT REPLACEMENT     BILATERAL    TOTAL KNEE ARTHROPLASTY  2011     HPI  from the history and physical done on the day of admission:   HPI: [name] is a 69 y.o. female with medical history significant for morbid obesity, status postcholecystectomy back in 2013, history of fatty liver who presents to the ED with shortness of breath, bilateral lower extremity swelling for the last couple months, worse over the last couple days--patient had not seen a medical provider since before COVID-19 hit No fever  Or chills - --no sick contacts at home - Additional history obtained from patient's husband at bedside-- --no leg pains no pleuritic symptoms - In the ED patient found to have O2 sats of 87% on room air she was placed on 2 L of oxygen   No Nausea,  Vomiting or Diarrhea - No productive cough, no chest pains - Liver ultrasound with status postcholecystectomy, cirrhosis and Solid-appearing mass in the right lobe of the liver.Further evaluation with MRI without and with contrast recommended. -Ill-defined hypoechoic lesion in the posterior right lobe of the liver measures 9.0 x 6.5 x 6.7 cm.   Hepatofugal flow in the main portal vein. - Chest x-ray suggestive of pulmonary edema - Troponin is 18, BNP 236 - CBC unremarkable except for platelets of 429 -- Creatinine 1.0 - Total bili 11.7 ALT 63 AST 199 albumin 2.1 total protein 6.4 - UA suggestive of UTI --EKG ---?? Afib (artifacts)--- on monitor appears sinus   Review of Systems: As mentioned in the history of present illness. All other systems reviewed and are negative.     Hospital Course:   Brief Narrative:  68 y.o. 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  -review of records indicate imaging evidence of liver cirrhosis from images back in February and March 2013 --History of fatty liver disease -Acute Viral Hepatitis profile---Negative for hep A, hep B or hep C Fasting lipid profile noted with HDL less than 10 triglycerides 129 total cholesterol 195 VLDL 26 - AFP tumor marker 3.2 (not elevated) -CEA is 3.2 (not elevated) - Patient apparently has never had a colonoscopy  -INR 1.2 - Elevated LFTs noted- MRI/MRCP findings noted with innumerable liver lesions --IR consulted for outpatient liver biopsy--tentatively being scheduled for 12/20/2023 -- Insufficient ascitic fluid for paracentesis -IR to schedule liver biopsy tentatively on 12/20/2023 -Pending pathology results patient will need GI and maybe  oncology follow-up   2) acute hypoxic respiratory failure--noted #1 above with third spacing and lower extremity edema - Continue supplemental oxygen currently requiring 2 L via nasal cannula -Unable to wean off O2 at this time -Okay to discharge home on 2 L of oxygen via nasal cannula   3)volume overload secondary to liver disease as above #1 --- significant lower extremity edema  --No significant Dempsey CHF findings -Elevated BNP and chest x-ray with pulmonary edema noted --echo from 12/11/2023 with EF of 70 to 75%, no regional wall motion abnormalities, no diastolic dysfunction, no aortic stenosis, no mitral stenosis - Creatinine is largely stable - With IV Lasix  and p.o. Aldactone  -Okay to discharge on p.o. Lasix  and p.o. Aldactone  - Daily weights-- 267 >>264 >>261 (?? Weight went up again) -Patient reluctant to use TED stockings - Elevate lower extremities    4) possible UTI--treat Rocephin , patient asymptomatic, No further antibiotics indicated   5)Morbid Obesity- -Low calorie diet, portion control and increase physical activity discussed with patient -Body  mass index is 43.47 kg/m.   Disposition: The patient is from: Home              Anticipated d/c is to: Home  Discharge Condition: stable   Follow UP   Follow-up Information     Eartha Flavors, Toribio, MD. Schedule an appointment as soon as possible for a visit in 1 week(s).   Specialty: Gastroenterology Why: Liver Cirrhosis Contact information: [address] [place] [address] [phone]                 Diet and Activity recommendation:  As advised  Discharge Instructions   Discharge Instructions     Call MD for:  difficulty breathing, headache or visual disturbances   Complete by: As directed    Call MD for:  persistant dizziness or light-headedness   Complete by: As directed    Call MD for:  persistant nausea and vomiting   Complete by: As directed    Call MD for:   temperature >100.4   Complete by: As directed    Diet - low sodium heart healthy   Complete by: As directed    Discharge instructions   Complete by: As directed    1)Very Low-salt diet advised---Less than 2 gm of Sodium per day advised----ok to use Mrs DASH salt substitute instead of Salt 2)Weigh yourself daily, call if you gain more than 3 pounds in 1 day or more than 5 pounds in 1 week as your diuretic medications may need to be adjusted 3)Limit your Fluid  intake to no more than 60 ounces (1.8 Liters) per day 4)Avoid ibuprofen/Advil/Aleve/Motrin/Goody Powders/Naproxen/BC powders/Meloxicam/Diclofenac/Indomethacin and other Nonsteroidal anti-inflammatory medications as these will make you more likely to bleed and can cause stomach ulcers, can also cause Kidney problems.  5)Repeat CBC and CMP Blood Tests Every Wednesday x 3 weeks ---starting 12/15/23 6)Please Follow up with Gastroenterologist Dr. Eartha or Dr. Hobson evaluation of your liver cirrhosis within 1 to 2 weeks -address [address], [address], [place] [address],,[place] Number [phone]   7)You need oxygen at home at 2 L via nasal cannula continuously while awake and while asleep--- smoking or having open fires around oxygen can cause fire, significant injury and death 8)You should get a call from interventional radiology in [place] to schedule your liver biopsy test--- which is Tentatively scheduled for Monday 12/20/2023 if you do Not get a call by Wednesday, 12/15/2023 please call [phone]   Increase activity slowly   Complete by: As directed          Discharge Medications     Allergies as of 12/12/2023       Reactions   Penicillins Rash        Medication List     STOP taking these medications    acetaminophen  650 MG CR tablet Commonly known as: TYLENOL    polyethylene glycol 17 g packet Commonly known as: MIRALAX  / GLYCOLAX        TAKE these medications    carvedilol  3.125 MG  tablet Commonly known as: Coreg  Take 1 tablet (3.125 mg total) by mouth 2 (two) times daily.   furosemide  40 MG tablet Commonly known as: Lasix  Take 1 tablet (40 mg total) by mouth daily.   lactulose  10 GM/15ML solution Commonly known as: CHRONULAC  Take 15 mLs (10 g total) by mouth daily.   spironolactone  25 MG tablet Commonly known as: Aldactone  Take 1 tablet (25 mg total) by mouth daily.  Durable Medical Equipment  (From admission, onward)           Start     Ordered   12/12/23 1003  For home use only DME oxygen  Once       Comments: Patient Saturations on Room Air at Rest = 91%   Patient Saturations on Room Air while Ambulating = 86 %   Patient Saturations on 2 Liters of oxygen while Ambulating = 92%    Dx----Congestive Heart Failure   Patient needs continuous O2 at 2 L/min continuously via nasal cannula with humidifier, with gaseous portability and conserving device  Question Answer Comment  Length of Need Lifetime   Mode or (Route) Nasal cannula   Liters per Minute 2   Frequency Continuous (stationary and portable oxygen unit needed)   Oxygen conserving device Yes   Oxygen delivery system Gas      12/12/23 1002            Major procedures and Radiology Reports - PLEASE review detailed and final reports for all details, in brief -   ECHOCARDIOGRAM COMPLETE Result Date: 12/11/2023    ECHOCARDIOGRAM REPORT   Patient Name:   KHALEELAH YOWELL Date of Exam: 12/11/2023 Medical Rec #:  [id number]     Height:       65.0 in Accession #:    [id number]    Weight:       264.0 lb Date of Birth:  [birth year]     BSA:          2.226 m Patient Age:    68 years      BP:           120/46 mmHg Patient Gender: F             HR:           92 bpm. Exam Location:  [facility] Procedure: 2D Echo, 3D Echo, Color Doppler, Cardiac Doppler and Strain Analysis            (Both Spectral and Color Flow Doppler were utilized during            procedure). Indications:    Dyspnea   History:        Patient has no prior history of Echocardiogram examinations.                 CHF; Risk Factors:Non-Smoker. Liver cirrhosis.  Sonographer:    Orvil Holmes RDCS Referring Phys: [phone] Clio Gerhart IMPRESSIONS  1. Left ventricular ejection fraction, by estimation, is 70 to 75%. The left ventricle has hyperdynamic function. The left ventricle has no regional wall motion abnormalities. Left ventricular diastolic parameters were normal.  2. Right ventricular systolic function is normal. The right ventricular size is normal. Tricuspid regurgitation signal is inadequate for assessing PA pressure.  3. The mitral valve is abnormal. Mild mitral valve regurgitation. No evidence of mitral stenosis.  4. The aortic valve has an indeterminant number of cusps. Aortic valve regurgitation is not visualized. No aortic stenosis is present.  5. Aortic dilatation noted. There is mild dilatation of the ascending aorta, measuring 36 mm.  6. The inferior vena cava is normal in size with greater than 50% respiratory variability, suggesting right atrial pressure of 3 mmHg. FINDINGS  Left Ventricle: Left ventricular ejection fraction, by estimation, is 70 to 75%. The left ventricle has hyperdynamic function. The left ventricle has no regional wall motion abnormalities. Strain was performed and the global longitudinal strain is indeterminate. The left  ventricular internal cavity size was normal in size. There is no left ventricular hypertrophy. Left ventricular diastolic parameters were normal. Right Ventricle: The right ventricular size is normal. Right vetricular wall thickness was not well visualized. Right ventricular systolic function is normal. Tricuspid regurgitation signal is inadequate for assessing PA pressure. Left Atrium: Left atrial size was normal in size. Right Atrium: Right atrial size was normal in size. Pericardium: There is no evidence of pericardial effusion. Mitral Valve: The mitral valve is abnormal.  Mild mitral valve regurgitation. No evidence of mitral valve stenosis. Tricuspid Valve: The tricuspid valve is normal in structure. Tricuspid valve regurgitation is trivial. No evidence of tricuspid stenosis. Aortic Valve: The aortic valve has an indeterminant number of cusps. Aortic valve regurgitation is not visualized. No aortic stenosis is present. Aortic valve mean gradient measures 7.6 mmHg. Aortic valve peak gradient measures 16.2 mmHg. Aortic valve area, by VTI measures 1.69 cm. Pulmonic Valve: The pulmonic valve was not well visualized. Pulmonic valve regurgitation is not visualized. No evidence of pulmonic stenosis. Aorta: The aortic root is normal in size and structure and aortic dilatation noted. There is mild dilatation of the ascending aorta, measuring 36 mm. Venous: The inferior vena cava is normal in size with greater than 50% respiratory variability, suggesting right atrial pressure of 3 mmHg. IAS/Shunts: No atrial level shunt detected by color flow Doppler. Additional Comments: 3D was performed not requiring image post processing on an independent workstation and was normal.  LEFT VENTRICLE PLAX 2D LVIDd:         4.50 cm   Diastology LVIDs:         2.20 cm   LV e' medial:    10.00 cm/s LV PW:         0.90 cm   LV E/e' medial:  12.6 LV IVS:        0.90 cm   LV e' lateral:   10.70 cm/s LVOT diam:     2.00 cm   LV E/e' lateral: 11.8 LV SV:         72 LV SV Index:   32 LVOT Area:     3.14 cm                           3D Volume EF:                          3D EF:        67 %                          LV EDV:       145 ml                          LV ESV:       48 ml                          LV SV:        97 ml RIGHT VENTRICLE RV Basal diam:  4.10 cm RV Mid diam:    3.40 cm RV S prime:     14.70 cm/s TAPSE (M-mode): 3.2 cm LEFT ATRIUM             Index        RIGHT ATRIUM  Index LA diam:        3.90 cm 1.75 cm/m   RA Area:     19.30 cm LA Vol (A2C):   75.7 ml 34.01 ml/m  RA Volume:   58.40 ml   26.24 ml/m LA Vol (A4C):   70.7 ml 31.76 ml/m LA Biplane Vol: 77.8 ml 34.95 ml/m  AORTIC VALVE AV Area (Vmax):    1.79 cm AV Area (Vmean):   1.90 cm AV Area (VTI):     1.69 cm AV Vmax:           201.41 cm/s AV Vmean:          127.841 cm/s AV VTI:            0.423 m AV Peak Grad:      16.2 mmHg AV Mean Grad:      7.6 mmHg LVOT Vmax:         115.00 cm/s LVOT Vmean:        77.400 cm/s LVOT VTI:          0.228 m LVOT/AV VTI ratio: 0.54  AORTA Ao Root diam: 3.00 cm Ao Asc diam:  3.60 cm MITRAL VALVE MV Area (PHT): 3.72 cm       SHUNTS MV Decel Time: 204 msec       Systemic VTI:  0.23 m MR Peak grad:    121.9 mmHg   Systemic Diam: 2.00 cm MR Mean grad:    98.0 mmHg MR Vmax:         552.00 cm/s MR Vmean:        480.0 cm/s MR PISA:         4.02 cm MR PISA Eff ROA: 23 mm MR PISA Radius:  0.80 cm MV E velocity: 126.00 cm/s MV A velocity: 79.00 cm/s MV E/A ratio:  1.59 Dorn Ross MD Electronically signed by Dorn Ross MD Signature Date/Time: 12/11/2023/2:04:18 PM    Final    US  ASCITES (ABDOMEN LIMITED) Result Date: 12/10/2023 CLINICAL DATA:  Patient with new onset cirrhosis liver, lesions. Trace ascites noted on recent MRI, request for diagnostic paracentesis. EXAM: LIMITED ABDOMEN ULTRASOUND FOR ASCITES TECHNIQUE: Limited ultrasound survey for ascites was performed in all four abdominal quadrants. COMPARISON:  Ultrasound abdomen limited 12/09/2023, MRI abdomen with and without contrast 12/10/2023 FINDINGS: No ascites noted in right upper, right lower, left upper or left lower quadrants. IMPRESSION: No ascites on limited ultrasound.  Paracentesis was not performed. Electronically Signed   By: Ester Sides M.D.   On: 12/10/2023 14:11   MR ABDOMEN MRCP W WO CONTAST Result Date: 12/10/2023 CLINICAL DATA:  Gallbladder/biliary cancer, staging Liver Mass. EXAM: MRI ABDOMEN WITHOUT AND WITH CONTRAST (INCLUDING MRCP) TECHNIQUE: Multiplanar multisequence MR imaging of the abdomen was performed both before and  after the administration of intravenous contrast. Heavily T2-weighted images of the biliary and pancreatic ducts were obtained, and three-dimensional MRCP images were rendered by post processing. CONTRAST:  10mL GADAVIST  GADOBUTROL  1 MMOL/ML IV SOLN COMPARISON:  Ultrasound abdomen from 12/09/2023. FINDINGS: Lower chest: Unremarkable MR appearance to the lung bases. No pleural effusion. No pericardial effusion. Normal heart size. Hepatobiliary: There is extensive liver surface irregularity/nodularity, compatible with cirrhosis. There are innumerable T1 hyperintense and T2 hypointense masses throughout the liver. The largest such mass is in the right hepatic dome, segments 8/4 a measuring up to 6.7 x 8.7 cm (series 7, image 34). The second largest lesion is in the right hepatic lobe, subcapsular segments 6/7 measuring 6.3 x 7.8  cm (series 7, image 47). This lesion corresponds to the lesion described on the recent ultrasound. Please note the first set of postcontrast images are little delayed and there are no true arterial phase images. These lesions show moderate enhancement on the first/30 second phase images. No washout of contrast on the delayed/equilibrium phase images. The lesion exhibits smooth thin T2 hypointense rim which is nonenhancing on the arterial phase images but exhibits enhancement on the 3 minute delayed images. There is no diffusion restriction on high B value images. No micro or macroscopic intralesional fat. This lesions are nonspecific and differential diagnosis includes but not limited to are multiple dysplastic nodules, multiple hepatic adenomas, metastases, etc. Considering the lack of true arterial phase and this lesions appear relatively hypoenhancing on the delayed/equilibrium phase images, multifocal HCC also remains in the differential diagnosis. Further evaluation with tissue sampling is recommended. Also short-term continued follow-up examination is recommended to document the growth  pattern. The middle hepatic vein is visualized and exhibit no thrombosis. The left and right hepatic veins are only seen near the central portion/confluence and appears patent. There is diminutive portal vein which is otherwise patent. No intrahepatic or extrahepatic bile duct dilatation. No choledocholithiasis. The gallbladder is surgically absent. Pancreas: There are multiple, scattered 1 cm or smaller T2 hyperintense nonenhancing foci throughout the pancreas (marked with electronic arrow sign on series 29). The largest 2 adjacent lesions in the uncinate process measure 6 x 9 and 8 x 9 mm (series 34, images 13 and 15). 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 Follow-up examination is recommended in 2 years. The pancreas is otherwise within normal limits. No peripancreatic fat stranding. Main pancreatic duct is not dilated. Spleen: Spleen is enlarged measuring up to 6.9 x 14.5 cm orthogonally on coronal plane. No focal lesion. Adrenals/Urinary Tract: Unremarkable adrenal glands. No hydroureteronephrosis. No suspicious renal mass. Stomach/Bowel: Visualized portions within the abdomen are unremarkable. No disproportionate dilation of bowel loops. Vascular/Lymphatic: No pathologically enlarged lymph nodes identified. No abdominal aortic aneurysm demonstrated. There is trace ascites mainly in the perihepatic region. There are extensive venous collaterals in the left upper quadrant mainly surrounding the spleen and in the left para-aortic region, including splenorenal shunts, compatible with sequela of portal hypertension. Other:  None. Musculoskeletal: No suspicious bone lesions identified. IMPRESSION: 1. There are innumerable liver lesions, as described above. These are nonspecific and differential diagnosis includes but not limited to are dysplastic nodules, hepatic adenomas, multifocal HCC, metastases, etc. Further evaluation with tissue sampling  is recommended. 2. 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 Follow-up examination is recommended in 2 years. 3. There is cirrhotic liver configuration with stigmata of portal hypertension including splenomegaly and trace ascites. 4. Multiple other nonacute observations, as described above. Electronically Signed   By: Ree Molt M.D.   On: 12/10/2023 09:15   MR 3D Recon At Scanner Result Date: 12/10/2023 CLINICAL DATA:  Gallbladder/biliary cancer, staging Liver Mass. EXAM: MRI ABDOMEN WITHOUT AND WITH CONTRAST (INCLUDING MRCP) TECHNIQUE: Multiplanar multisequence MR imaging of the abdomen was performed both before and after the administration of intravenous contrast. Heavily T2-weighted images of the biliary and pancreatic ducts were obtained, and three-dimensional MRCP images were rendered by post processing. CONTRAST:  10mL GADAVIST  GADOBUTROL  1 MMOL/ML IV SOLN COMPARISON:  Ultrasound abdomen from 12/09/2023. FINDINGS: Lower chest: Unremarkable MR appearance to the lung bases. No pleural effusion.  No pericardial effusion. Normal heart size. Hepatobiliary: There is extensive liver surface irregularity/nodularity, compatible with cirrhosis. There are innumerable T1 hyperintense and T2 hypointense masses throughout the liver. The largest such mass is in the right hepatic dome, segments 8/4 a measuring up to 6.7 x 8.7 cm (series 7, image 34). The second largest lesion is in the right hepatic lobe, subcapsular segments 6/7 measuring 6.3 x 7.8 cm (series 7, image 47). This lesion corresponds to the lesion described on the recent ultrasound. Please note the first set of postcontrast images are little delayed and there are no true arterial phase images. These lesions show moderate enhancement on the first/30 second phase images. No washout of contrast on the delayed/equilibrium phase images. The lesion  exhibits smooth thin T2 hypointense rim which is nonenhancing on the arterial phase images but exhibits enhancement on the 3 minute delayed images. There is no diffusion restriction on high B value images. No micro or macroscopic intralesional fat. This lesions are nonspecific and differential diagnosis includes but not limited to are multiple dysplastic nodules, multiple hepatic adenomas, metastases, etc. Considering the lack of true arterial phase and this lesions appear relatively hypoenhancing on the delayed/equilibrium phase images, multifocal HCC also remains in the differential diagnosis. Further evaluation with tissue sampling is recommended. Also short-term continued follow-up examination is recommended to document the growth pattern. The middle hepatic vein is visualized and exhibit no thrombosis. The left and right hepatic veins are only seen near the central portion/confluence and appears patent. There is diminutive portal vein which is otherwise patent. No intrahepatic or extrahepatic bile duct dilatation. No choledocholithiasis. The gallbladder is surgically absent. Pancreas: There are multiple, scattered 1 cm or smaller T2 hyperintense nonenhancing foci throughout the pancreas (marked with electronic arrow sign on series 29). The largest 2 adjacent lesions in the uncinate process measure 6 x 9 and 8 x 9 mm (series 34, images 13 and 15). 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 Follow-up examination is recommended in 2 years. The pancreas is otherwise within normal limits. No peripancreatic fat stranding. Main pancreatic duct is not dilated. Spleen: Spleen is enlarged measuring up to 6.9 x 14.5 cm orthogonally on coronal plane. No focal lesion. Adrenals/Urinary Tract: Unremarkable adrenal glands. No hydroureteronephrosis. No suspicious renal mass. Stomach/Bowel: Visualized portions within the abdomen are unremarkable. No  disproportionate dilation of bowel loops. Vascular/Lymphatic: No pathologically enlarged lymph nodes identified. No abdominal aortic aneurysm demonstrated. There is trace ascites mainly in the perihepatic region. There are extensive venous collaterals in the left upper quadrant mainly surrounding the spleen and in the left para-aortic region, including splenorenal shunts, compatible with sequela of portal hypertension. Other:  None. Musculoskeletal: No suspicious bone lesions identified. IMPRESSION: 1. There are innumerable liver lesions, as described above. These are nonspecific and differential diagnosis includes but not limited to are dysplastic nodules, hepatic adenomas, multifocal HCC, metastases, etc. Further evaluation with tissue sampling is recommended. 2. 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 Follow-up examination is recommended in 2 years. 3. There is cirrhotic liver configuration with stigmata of portal hypertension including splenomegaly and trace ascites. 4. Multiple other nonacute observations, as described above. Electronically Signed   By: Ree Molt M.D.   On: 12/10/2023 09:15   US  Abdomen Limited RUQ (LIVER/GB) Result Date: 12/09/2023 CLINICAL DATA:  Abnormal LFTs. EXAM: ULTRASOUND ABDOMEN LIMITED RIGHT UPPER QUADRANT COMPARISON:  Ultrasound  dated 06/13/2011. FINDINGS: Gallbladder: Cholecystectomy. Common bile duct: Diameter: 9 mm Liver: The liver demonstrates a heterogeneous echogenicity and coarsened echotexture. There is irregularity of the liver contour in keeping with cirrhosis. Ill-defined hypoechoic lesion in the posterior right lobe of the liver measures 9.0 x 6.5 x 6.7 cm. Further evaluation with multiphasic CT or MRI recommended. There is reversal of flow in the main portal vein. Other: None. IMPRESSION: 1. Cholecystectomy. 2. Cirrhosis. Solid-appearing mass in the  right lobe of the liver. Further evaluation with MRI without and with contrast recommended. 3. Hepatofugal flow in the main portal vein. Electronically Signed   By: Vanetta Chou M.D.   On: 12/09/2023 15:16   DG Chest 2 View Result Date: 12/09/2023 EXAM: 2 VIEW(S) XRAY OF THE CHEST 12/09/2023 11:53:00 AM COMPARISON: 06/13/2008 chest radiograph. CLINICAL HISTORY: SOB. Pt arrived via POV from home c/o bilateral lower extremity swelling for past couple of months. FINDINGS: LUNGS AND PLEURA: Diffuse interstitial opacities. No focal pulmonary opacity. No pleural effusion. No pneumothorax. HEART AND MEDIASTINUM: No acute abnormality of the cardiac and mediastinal silhouettes. BONES AND SOFT TISSUES: No acute osseous abnormality. IMPRESSION: 1. Diffuse interstitial opacities, likely pulmonary edema. Electronically signed by: Manford Cummins MD 12/09/2023 12:46 PM EDT RP Workstation: [id number]   Today   Subjective    [name] today has no new complaints - Husband at bedside - Voiding well - No significant dyspnea on exertion - Unable to wean off O2 continue to require oxygen at 2 L per nasal cannula   Patient has been seen and examined prior to discharge   Objective   Blood pressure (!) 105/55, pulse 62, temperature 97.6 F (36.4 C), temperature source Oral, resp. rate 20, height 5' 5 (1.651 m), weight 119.9 kg, SpO2 96%.   Intake/Output Summary (Last 24 hours) at 12/12/2023 1916 Last data filed at 12/12/2023 0900 Gross per 24 hour  Intake 480 ml  Output --  Net 480 ml    Exam Gen:- Awake Alert, morbidly obese, in no acute distress HEENT:- [place].AT, No sclera icterus Nose- [place] 2L/min Neck-Supple Neck, No JVD,.  Lungs-improved air movement, no wheezing  CV- S1, S2 normal, regular  Abd-  +ve B.Sounds, Abd Soft, No significant tenderness, increased truncal adiposity noted Extremity/Skin:-Improved pitting  edema, pedal pulses present  Psych-affect is appropriate, oriented x3 Neuro-no new  focal deficits, no tremors   Data Review   CBC w Diff:  Lab Results  Component Value Date   WBC 6.9 12/09/2023   HGB 12.6 12/09/2023   HGB 13.4 05/10/2015   HCT 38.7 12/09/2023   HCT 38.4 05/10/2015   PLT 429 (H) 12/09/2023   PLT 238 05/10/2015   LYMPHOPCT 15 12/09/2023   MONOPCT 13 12/09/2023   EOSPCT 7 12/09/2023   BASOPCT 1 12/09/2023   CMP:  Lab Results  Component Value Date   NA 133 (L) 12/12/2023   NA 142 05/10/2015   K 3.9 12/12/2023   CL 92 (L) 12/12/2023   CO2 29 12/12/2023   BUN 25 (H) 12/12/2023   BUN 11 05/10/2015   CREATININE 1.15 (H) 12/12/2023   PROT 5.3 (L) 12/12/2023   ALBUMIN 1.8 (L) 12/12/2023   BILITOT 10.9 (H) 12/12/2023   ALKPHOS 622 (H) 12/12/2023   AST 218 (H) 12/12/2023   ALT 59 (H) 12/12/2023   Total Discharge time is about 33 minutes  Rendall Carwin M.D on 12/12/2023 at 7:16 PM  Go to [email or web address] -  for contact info  [facility] - Office  [phone]   

## 2023-12-12 NOTE — Progress Notes (Addendum)
    SATURATION QUALIFICATIONS: (This note is used to comply with regulatory documentation for home oxygen)   Patient Saturations on Room Air at Rest = 91%   Patient Saturations on Room Air while Ambulating = 86 %   Patient Saturations on 2 Liters of oxygen while Ambulating = 92%    Dx----Congestive Heart Failure   Patient needs continuous O2 at 2 L/min continuously via nasal cannula with humidifier, with gaseous portability and conserving device  Rendall Carwin, MD

## 2023-12-12 NOTE — TOC Transition Note (Signed)
 Transition of Care [facility]) - Discharge Note   Patient Details  Name: [name] MRN: [id number] Date of Birth: 1955-09-03  [facility] CM/SW Contact:  Nena LITTIE Coffee, RN Phone Number: 12/12/2023, 3:33 PM   Clinical Narrative:    Pt dc'd home c/[employer] providing oxygen.    Final next level of care: Home/Self Care Barriers to Discharge: Barriers Resolved   Patient Goals and CMS Choice            Discharge Placement                       Discharge Plan and Services Additional resources added to the After Visit Summary for                  DME Arranged: Oxygen DME Agency: [employer] Date DME Agency Contacted: 12/12/23   Representative spoke with at DME Agency: Dorothe            Social Drivers of Health (SDOH) Interventions SDOH Screenings   Food Insecurity: No Food Insecurity (12/09/2023)  Housing: Low Risk  (12/09/2023)  Transportation Needs: No Transportation Needs (12/09/2023)  Utilities: Not At Risk (12/09/2023)  Social Connections: Socially Integrated (12/09/2023)  Tobacco Use: Low Risk  (12/09/2023)     Readmission Risk Interventions     No data to display

## 2023-12-12 NOTE — Discharge Instructions (Signed)
 1)Very Low-salt diet advised---Less than 2 gm of Sodium per day advised----ok to use Mrs DASH salt substitute instead of Salt 2)Weigh yourself daily, call if you gain more than 3 pounds in 1 day or more than 5 pounds in 1 week as your diuretic medications may need to be adjusted 3)Limit your Fluid  intake to no more than 60 ounces (1.8 Liters) per day 4)Avoid ibuprofen/Advil/Aleve/Motrin/Goody Powders/Naproxen/BC powders/Meloxicam/Diclofenac/Indomethacin and other Nonsteroidal anti-inflammatory medications as these will make you more likely to bleed and can cause stomach ulcers, can also cause Kidney problems.  5)Repeat CBC and CMP Blood Tests Every Wednesday x 3 weeks ---starting 12/15/23 6)Please Follow up with Gastroenterologist Dr. Eartha or Dr. Hobson evaluation of your liver cirrhosis within 1 to 2 weeks -address [address], [place] [address],,[place] Number [phone]   7)You need oxygen at home at 2 L via nasal cannula continuously while awake and while asleep--- smoking or having open fires around oxygen can cause fire, significant injury and death 8)You should get a call from interventional radiology in [place] to schedule your liver biopsy test--- which is Tentatively scheduled for Monday 12/20/2023 if you do Not get a call by Wednesday, 12/15/2023 please call [phone]

## 2023-12-13 ENCOUNTER — Other Ambulatory Visit: Payer: Self-pay | Admitting: Physician Assistant

## 2023-12-13 DIAGNOSIS — R160 Hepatomegaly, not elsewhere classified: Secondary | ICD-10-CM

## 2023-12-13 NOTE — Progress Notes (Signed)
 Heart Failure Navigator Progress Note  Assessed for Heart & Vascular TOC clinic readiness. Patient discharged from [facility] on 12/12/23.  Patient does not meet criteria due to EF 70-75%.  Per MD note volume overload secondary to liver disease and no significant Frank CHF findings. Navigator available for reassessment of patient and will sign off at this time.  Charmaine Pines, RN, BSN [facility] Heart Failure Navigator Secure Chat Only

## 2023-12-17 ENCOUNTER — Other Ambulatory Visit: Payer: Self-pay | Admitting: Radiology

## 2023-12-17 DIAGNOSIS — R160 Hepatomegaly, not elsewhere classified: Secondary | ICD-10-CM

## 2023-12-20 ENCOUNTER — Encounter (HOSPITAL_COMMUNITY): Payer: Self-pay

## 2023-12-20 ENCOUNTER — Ambulatory Visit (HOSPITAL_COMMUNITY)
Admit: 2023-12-20 | Discharge: 2023-12-20 | Disposition: A | Discharge status: Home / Self Care | Attending: Family Medicine | Admitting: Family Medicine

## 2023-12-20 DIAGNOSIS — C220 Liver cell carcinoma: Secondary | ICD-10-CM | POA: Diagnosis not present

## 2023-12-20 DIAGNOSIS — E669 Obesity, unspecified: Secondary | ICD-10-CM | POA: Diagnosis not present

## 2023-12-20 DIAGNOSIS — K760 Fatty (change of) liver, not elsewhere classified: Secondary | ICD-10-CM | POA: Insufficient documentation

## 2023-12-20 DIAGNOSIS — R7989 Other specified abnormal findings of blood chemistry: Secondary | ICD-10-CM | POA: Insufficient documentation

## 2023-12-20 DIAGNOSIS — R160 Hepatomegaly, not elsewhere classified: Secondary | ICD-10-CM | POA: Diagnosis present

## 2023-12-20 LAB — CBC
HCT: 34.8 % — ABNORMAL LOW (ref 36.0–46.0)
Hemoglobin: 11.6 g/dL — ABNORMAL LOW (ref 12.0–15.0)
MCH: 30.7 pg (ref 26.0–34.0)
MCHC: 33.3 g/dL (ref 30.0–36.0)
MCV: 92.1 fL (ref 80.0–100.0)
Platelets: 383 K/uL (ref 150–400)
RBC: 3.78 MIL/uL — ABNORMAL LOW (ref 3.87–5.11)
RDW: 18.3 % — ABNORMAL HIGH (ref 11.5–15.5)
WBC: 10.4 K/uL (ref 4.0–10.5)
nRBC: 0 % (ref 0.0–0.2)

## 2023-12-20 LAB — PROTIME-INR
INR: 1.1 (ref 0.8–1.2)
Prothrombin Time: 14.7 s (ref 11.4–15.2)

## 2023-12-20 MED ORDER — LIDOCAINE HCL 1 % IJ SOLN
INTRAMUSCULAR | Status: AC
Start: 2023-12-20 — End: 2023-12-20
  Filled 2023-12-20: qty 20

## 2023-12-20 MED ORDER — MIDAZOLAM HCL 2 MG/2ML IJ SOLN
INTRAMUSCULAR | Status: AC
  Filled 2023-12-20: qty 2

## 2023-12-20 MED ORDER — HYDROCODONE-ACETAMINOPHEN 5-325 MG PO TABS: 1.0000 | ORAL_TABLET | ORAL | Status: DC | PRN

## 2023-12-20 MED ORDER — FENTANYL CITRATE (PF) 100 MCG/2ML IJ SOLN
INTRAMUSCULAR | Status: AC
  Filled 2023-12-20: qty 2

## 2023-12-20 MED ORDER — SODIUM CHLORIDE 0.9 % IV SOLN: INTRAVENOUS | Status: DC

## 2023-12-20 MED ORDER — MIDAZOLAM HCL 2 MG/2ML IJ SOLN
INTRAMUSCULAR | Status: AC | PRN
  Administered 2023-12-20: 1 mg via INTRAVENOUS

## 2023-12-20 MED ORDER — FENTANYL CITRATE (PF) 100 MCG/2ML IJ SOLN
INTRAMUSCULAR | Status: AC | PRN
  Administered 2023-12-20: 50 ug via INTRAVENOUS

## 2023-12-20 NOTE — Procedures (Signed)
  Procedure:  US  core biopsy liver lesion  18g x2 Preprocedure diagnosis: The encounter diagnosis was Liver mass, right lobe. Postprocedure diagnosis: same EBL:    minimal Complications:   none immediate  See full dictation in [employer].  CHARM Toribio Faes MD Main # [phone] Pager  [phone] Mobile [phone]

## 2023-12-20 NOTE — Discharge Instructions (Signed)
 Please call Interventional Radiology clinic [phone] with any questions or concerns.   You may remove your dressing and shower tomorrow.   Liver Biopsy, Care After After a liver biopsy, it is common to have these things in the area where the biopsy was done. You may: Have pain. Feel sore. Have bruising. You may also feel tired for a few days. Follow these instructions at home: Medicines Take over-the-counter and prescription medicines only as told by your doctor. If you were prescribed an antibiotic medicine, take it as told by your doctor. Do not stop taking the antibiotic, even if you start to feel better. Do not take medicines that may thin your blood. These medicines include aspirin and ibuprofen. Take them only if your doctor tells you to. If told, take steps to prevent problems with pooping (constipation). You may need to: Drink enough fluid to keep your pee (urine) pale yellow. Take medicines. You will be told what medicines to take. Eat foods that are high in fiber. These include beans, whole grains, and fresh fruits and vegetables. Limit foods that are high in fat and sugar. These include fried or sweet foods. Ask your doctor if you should avoid driving or using machines while you are taking your medicine. Caring for your incision Follow instructions from your doctor about how to take care of your cut from surgery (incisions). Make sure you: Wash your hands with soap and water for at least 20 seconds before and after you change your bandage. If you cannot use soap and water, use hand sanitizer. Change your bandage. Leavestitches or skin glue in place for at least two weeks. Leave tape strips alone unless you are told to take them off. You may trim the edges of the tape strips if they curl up. Check your incision every day for signs of infection. Check for: Redness, swelling, or more pain. Fluid or blood. Warmth. Pus or a bad smell. Do not take baths, swim, or use a  hot tub. Ask your doctor about taking showers or sponge baths. Activity Rest at home for 1-2 days, or as told by your doctor. Get up to take short walks every 1 to 2 hours. Ask for help if you feel weak or unsteady. Do not lift anything that is heavier than 10 lb (4.5 kg), or the limit that you are told. Do not play contact sports for 2 weeks after the procedure. Return to your normal activities as told by your doctor. Ask what activities are safe for you. General instructions  Do not drink alcohol in the first week after the procedure. Plan to have a responsible adult care for you for the time you are told after you leave the hospital or clinic. This is important. It is up to you to get the results of your procedure. Ask how to get your results when they are ready. Keep all follow-up visits.   Contact a doctor if: You have more bleeding in your incision. Your incision swells, or is red and more painful. You have fluid that comes from your incision. You develop a rash. You have fever or chills. Get help right away if: You have swelling, bloating, or pain in your belly (abdomen). You get dizzy or faint. You vomit or you feel like vomiting. You have trouble breathing or feel short of breath. You have chest pain. You have problems talking or seeing. You have trouble with your balance or moving your arms or legs. These symptoms may be an emergency. Get help  right away. Call your local emergency services (911 in the [place] Do not wait to see if the symptoms will go away. Do not drive yourself to the hospital. Summary After the procedure, it is common to have pain, soreness, bruising, and tiredness. Your doctor will tell you how to take care of yourself at home. Change your bandage, take your medicines, and limit your activities as told by your doctor. Call your doctor if you have symptoms of infection. Get help right away if your belly swells, your cut bleeds a lot, or you have trouble  talking or breathing. This information is not intended to replace advice given to you by your health care provider. Make sure you discuss any questions you have with your healthcare provider. Document Revised: 03/04/2020 Document Reviewed: 03/04/2020 Elsevier Patient Education  2022 [employer] Inc[employer]     Moderate Conscious Sedation, Adult, Care After This sheet gives you information about how to care for yourself after your procedure. Your health care provider may also give you more specific instructions. If you have problems or questions, contact your health care provider. What can I expect after the procedure? After the procedure, it is common to have: Sleepiness for several hours. Impaired judgment for several hours. Difficulty with balance. Vomiting if you eat too soon. Follow these instructions at home: For the time period you were told by your health care provider: Rest. Do not participate in activities where you could fall or become injured. Do not drive or use machinery. Do not drink alcohol. Do not take sleeping pills or medicines that cause drowsiness. Do not make important decisions or sign legal documents. Do not take care of children on your own.        Eating and drinking Follow the diet recommended by your health care provider. Drink enough fluid to keep your urine pale yellow. If you vomit: Drink water, juice, or soup when you can drink without vomiting. Make sure you have little or no nausea before eating solid foods.    General instructions Take over-the-counter and prescription medicines only as told by your health care provider. Have a responsible adult stay with you for the time you are told. It is important to have someone help care for you until you are awake and alert. Do not smoke. Keep all follow-up visits as told by your health care provider. This is important. Contact a health care provider if: You are still sleepy or having trouble with balance after  24 hours. You feel light-headed. You keep feeling nauseous or you keep vomiting. You develop a rash. You have a fever. You have redness or swelling around the IV site. Get help right away if: You have trouble breathing. You have new-onset confusion at home. Summary After the procedure, it is common to feel sleepy, have impaired judgment, or feel nauseous if you eat too soon. Rest after you get home. Know the things you should not do after the procedure. Follow the diet recommended by your health care provider and drink enough fluid to keep your urine pale yellow. Get help right away if you have trouble breathing or new-onset confusion at home. This information is not intended to replace advice given to you by your health care provider. Make sure you discuss any questions you have with your health care provider. Document Revised: 08/18/2019 Document Reviewed: 03/16/2019 Elsevier Patient Education  2021 [employer].

## 2023-12-20 NOTE — H&P (Signed)
 Chief Complaint: Jaundice, liver lesions, cirrhosis; referred for image guided liver lesion biopsy for further evaluation  Referring Provider(s): Emokpae,C  Supervising Physician: Johann Sieving  Patient Status: [name] - Out-pt  History of Present Illness: [name] is a 68 y.o. female with PMH sig for arthritis, obesity, fatty liver, prior cholecystectomy 2013 who was recently admitted to [facility] [place] for jaundice, elevated LFT's, LE edema, dyspnea. She was subsequently found to have CHF/pulmonary edema and MRI abd findings revealing:   1. There are innumerable liver lesions, as described above. These are nonspecific and differential diagnosis includes but not limited to are dysplastic nodules, hepatic adenomas, multifocal HCC, metastases, etc. Further evaluation with tissue sampling is recommended. 2. 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 Follow-up examination is recommended in 2 years. 3. There is cirrhotic liver configuration with stigmata of portal hypertension including splenomegaly and trace ascites  Normal CEA, AFP,neg hepatitis panel; no prior hx malignancy  She is scheduled today as an outpatient  for image guided liver lesion biopsy for further evaluation.   Patient is Full Code  Past Medical History:  Diagnosis Date   Arthritis    Obesity    Vaginal atrophy 05/27/2015   Vaginal irritation 05/27/2015    Past Surgical History:  Procedure Laterality Date   CATARACT EXTRACTION W/PHACO Right 06/28/2017   Procedure: CATARACT EXTRACTION PHACO AND INTRAOCULAR LENS PLACEMENT (IOC);  Surgeon: Perley Hamilton, MD;  Location: [facility];  Service: Ophthalmology;  Laterality: Right;  CDE: 7.94   CHOLECYSTECTOMY  07/09/2011   Procedure: LAPAROSCOPIC CHOLECYSTECTOMY WITH INTRAOPERATIVE CHOLANGIOGRAM;  Surgeon: Camellia CHRISTELLA Blush, MD,FACS;  Location: [facility];  Service: General;   Laterality: N/A;  laparoscopic cholecystectomy with cholangiogram   JOINT REPLACEMENT     BILATERAL    TOTAL KNEE ARTHROPLASTY  2011    Allergies: Penicillins  Medications: Prior to Admission medications   Medication Sig Start Date End Date Taking? Authorizing Provider  carvedilol  (COREG ) 3.125 MG tablet Take 1 tablet (3.125 mg total) by mouth 2 (two) times daily. Patient taking differently: Take by mouth 2 (two) times daily. 12/12/23 12/11/24 Yes Emokpae, Courage, MD  furosemide  (LASIX ) 40 MG tablet Take 1 tablet (40 mg total) by mouth daily. 12/12/23 12/11/24 Yes Emokpae, Courage, MD  lactulose  (CHRONULAC ) 10 GM/15ML solution Take 15 mLs (10 g total) by mouth daily. 12/12/23  Yes Emokpae, Courage, MD  sodium chloride  (OCEAN) 0.65 % SOLN nasal spray Place 1 spray into both nostrils as needed for congestion.   Yes [provider]  spironolactone  (ALDACTONE ) 25 MG tablet Take 1 tablet (25 mg total) by mouth daily. 12/12/23 12/11/24 Yes Pearlean Manus, MD     Family History  Problem Relation Age of Onset   Cancer Mother        lung   Diabetes Father    Heart attack Paternal Grandmother    Heart attack Paternal Grandfather    Diabetes Brother     Social History   Socioeconomic History   Marital status: Married    Spouse name: Not on file   Number of children: Not on file   Years of education: Not on file   Highest education level: Not on file  Occupational History   Not on file  Tobacco Use   Smoking status: Never    Passive exposure: Never   Smokeless tobacco: Never  Vaping Use   Vaping status: Never Used  Substance and Sexual  Activity   Alcohol use: No   Drug use: No   Sexual activity: Yes    Birth control/protection: Post-menopausal  Other Topics Concern   Not on file  Social History Narrative   Not on file   Social Drivers of Health   Financial Resource Strain: Not on file  Food Insecurity: No Food Insecurity (12/09/2023)   Hunger Vital Sign    Worried  About Running Out of Food in the Last Year: Never true    Ran Out of Food in the Last Year: Never true  Transportation Needs: No Transportation Needs (12/09/2023)   PRAPARE - Administrator, Civil Service (Medical): No    Lack of Transportation (Non-Medical): No  Physical Activity: Not on file  Stress: Not on file  Social Connections: Socially Integrated (12/09/2023)   Social Connection and Isolation Panel    Frequency of Communication with Friends and Family: Twice a week    Frequency of Social Gatherings with Friends and Family: Twice a week    Attends Religious Services: More than 4 times per year    Active Member of [employer] or Organizations: Yes    Attends Engineer, structural: More than 4 times per year    Marital Status: Married       Review of Systems: denies fever,HA,CP, worsening dyspnea, cough, abd/back pain,N/V or bleeding  Vital Signs: BP 123/69   Pulse 64   Temp 97.6 F (36.4 C) (Oral)   Resp 20   SpO2 98%   Advance Care Plan: no documents on file.  Physical Exam: marked jaundice with scleral icterus; awake/alert; chest- distant BS bilat; heart- RRR; abd-obese,soft,+BS,NT; bilat LE edema noted  Imaging: ECHOCARDIOGRAM COMPLETE Result Date: 12/11/2023    ECHOCARDIOGRAM REPORT   Patient Name:   [name] Date of Exam: 12/11/2023 Medical Rec #:  [id number]     Height:       65.0 in Accession #:    [id number]    Weight:       264.0 lb Date of Birth:  Jun 26, 1955     BSA:          2.226 m Patient Age:    68 years      BP:           120/46 mmHg Patient Gender: F             HR:           92 bpm. Exam Location:  [facility] Procedure: 2D Echo, 3D Echo, Color Doppler, Cardiac Doppler and Strain Analysis            (Both Spectral and Color Flow Doppler were utilized during            procedure). Indications:    Dyspnea  History:        Patient has no prior history of Echocardiogram examinations.                 CHF; Risk Factors:Non-Smoker. Liver cirrhosis.   Sonographer:    Orvil Holmes RDCS Referring Phys: [phone] COURAGE EMOKPAE IMPRESSIONS  1. Left ventricular ejection fraction, by estimation, is 70 to 75%. The left ventricle has hyperdynamic function. The left ventricle has no regional wall motion abnormalities. Left ventricular diastolic parameters were normal.  2. Right ventricular systolic function is normal. The right ventricular size is normal. Tricuspid regurgitation signal is inadequate for assessing PA pressure.  3. The mitral valve is abnormal. Mild mitral valve regurgitation. No evidence of  mitral stenosis.  4. The aortic valve has an indeterminant number of cusps. Aortic valve regurgitation is not visualized. No aortic stenosis is present.  5. Aortic dilatation noted. There is mild dilatation of the ascending aorta, measuring 36 mm.  6. The inferior vena cava is normal in size with greater than 50% respiratory variability, suggesting right atrial pressure of 3 mmHg. FINDINGS  Left Ventricle: Left ventricular ejection fraction, by estimation, is 70 to 75%. The left ventricle has hyperdynamic function. The left ventricle has no regional wall motion abnormalities. Strain was performed and the global longitudinal strain is indeterminate. The left ventricular internal cavity size was normal in size. There is no left ventricular hypertrophy. Left ventricular diastolic parameters were normal. Right Ventricle: The right ventricular size is normal. Right vetricular wall thickness was not well visualized. Right ventricular systolic function is normal. Tricuspid regurgitation signal is inadequate for assessing PA pressure. Left Atrium: Left atrial size was normal in size. Right Atrium: Right atrial size was normal in size. Pericardium: There is no evidence of pericardial effusion. Mitral Valve: The mitral valve is abnormal. Mild mitral valve regurgitation. No evidence of mitral valve stenosis. Tricuspid Valve: The tricuspid valve is normal in structure. Tricuspid  valve regurgitation is trivial. No evidence of tricuspid stenosis. Aortic Valve: The aortic valve has an indeterminant number of cusps. Aortic valve regurgitation is not visualized. No aortic stenosis is present. Aortic valve mean gradient measures 7.6 mmHg. Aortic valve peak gradient measures 16.2 mmHg. Aortic valve area, by VTI measures 1.69 cm. Pulmonic Valve: The pulmonic valve was not well visualized. Pulmonic valve regurgitation is not visualized. No evidence of pulmonic stenosis. Aorta: The aortic root is normal in size and structure and aortic dilatation noted. There is mild dilatation of the ascending aorta, measuring 36 mm. Venous: The inferior vena cava is normal in size with greater than 50% respiratory variability, suggesting right atrial pressure of 3 mmHg. IAS/Shunts: No atrial level shunt detected by color flow Doppler. Additional Comments: 3D was performed not requiring image post processing on an independent workstation and was normal.  LEFT VENTRICLE PLAX 2D LVIDd:         4.50 cm   Diastology LVIDs:         2.20 cm   LV e' medial:    10.00 cm/s LV PW:         0.90 cm   LV E/e' medial:  12.6 LV IVS:        0.90 cm   LV e' lateral:   10.70 cm/s LVOT diam:     2.00 cm   LV E/e' lateral: 11.8 LV SV:         72 LV SV Index:   32 LVOT Area:     3.14 cm                           3D Volume EF:                          3D EF:        67 %                          LV EDV:       145 ml                          LV  ESV:       48 ml                          LV SV:        97 ml RIGHT VENTRICLE RV Basal diam:  4.10 cm RV Mid diam:    3.40 cm RV S prime:     14.70 cm/s TAPSE (M-mode): 3.2 cm LEFT ATRIUM             Index        RIGHT ATRIUM           Index LA diam:        3.90 cm 1.75 cm/m   RA Area:     19.30 cm LA Vol (A2C):   75.7 ml 34.01 ml/m  RA Volume:   58.40 ml  26.24 ml/m LA Vol (A4C):   70.7 ml 31.76 ml/m LA Biplane Vol: 77.8 ml 34.95 ml/m  AORTIC VALVE AV Area (Vmax):    1.79 cm AV Area  (Vmean):   1.90 cm AV Area (VTI):     1.69 cm AV Vmax:           201.41 cm/s AV Vmean:          127.841 cm/s AV VTI:            0.423 m AV Peak Grad:      16.2 mmHg AV Mean Grad:      7.6 mmHg LVOT Vmax:         115.00 cm/s LVOT Vmean:        77.400 cm/s LVOT VTI:          0.228 m LVOT/AV VTI ratio: 0.54  AORTA Ao Root diam: 3.00 cm Ao Asc diam:  3.60 cm MITRAL VALVE MV Area (PHT): 3.72 cm       SHUNTS MV Decel Time: 204 msec       Systemic VTI:  0.23 m MR Peak grad:    121.9 mmHg   Systemic Diam: 2.00 cm MR Mean grad:    98.0 mmHg MR Vmax:         552.00 cm/s MR Vmean:        480.0 cm/s MR PISA:         4.02 cm MR PISA Eff ROA: 23 mm MR PISA Radius:  0.80 cm MV E velocity: 126.00 cm/s MV A velocity: 79.00 cm/s MV E/A ratio:  1.59 Dorn Ross MD Electronically signed by Dorn Ross MD Signature Date/Time: 12/11/2023/2:04:18 PM    Final    US  ASCITES (ABDOMEN LIMITED) Result Date: 12/10/2023 CLINICAL DATA:  Patient with new onset cirrhosis liver, lesions. Trace ascites noted on recent MRI, request for diagnostic paracentesis. EXAM: LIMITED ABDOMEN ULTRASOUND FOR ASCITES TECHNIQUE: Limited ultrasound survey for ascites was performed in all four abdominal quadrants. COMPARISON:  Ultrasound abdomen limited 12/09/2023, MRI abdomen with and without contrast 12/10/2023 FINDINGS: No ascites noted in right upper, right lower, left upper or left lower quadrants. IMPRESSION: No ascites on limited ultrasound.  Paracentesis was not performed. Electronically Signed   By: Ester Sides M.D.   On: 12/10/2023 14:11   MR ABDOMEN MRCP W WO CONTAST Result Date: 12/10/2023 CLINICAL DATA:  Gallbladder/biliary cancer, staging Liver Mass. EXAM: MRI ABDOMEN WITHOUT AND WITH CONTRAST (INCLUDING MRCP) TECHNIQUE: Multiplanar multisequence MR imaging of the abdomen was performed both before and after the administration of intravenous contrast. Heavily T2-weighted images of the biliary and pancreatic ducts were  obtained, and  three-dimensional MRCP images were rendered by post processing. CONTRAST:  10mL GADAVIST  GADOBUTROL  1 MMOL/ML IV SOLN COMPARISON:  Ultrasound abdomen from 12/09/2023. FINDINGS: Lower chest: Unremarkable MR appearance to the lung bases. No pleural effusion. No pericardial effusion. Normal heart size. Hepatobiliary: There is extensive liver surface irregularity/nodularity, compatible with cirrhosis. There are innumerable T1 hyperintense and T2 hypointense masses throughout the liver. The largest such mass is in the right hepatic dome, segments 8/4 a measuring up to 6.7 x 8.7 cm (series 7, image 34). The second largest lesion is in the right hepatic lobe, subcapsular segments 6/7 measuring 6.3 x 7.8 cm (series 7, image 47). This lesion corresponds to the lesion described on the recent ultrasound. Please note the first set of postcontrast images are little delayed and there are no true arterial phase images. These lesions show moderate enhancement on the first/30 second phase images. No washout of contrast on the delayed/equilibrium phase images. The lesion exhibits smooth thin T2 hypointense rim which is nonenhancing on the arterial phase images but exhibits enhancement on the 3 minute delayed images. There is no diffusion restriction on high B value images. No micro or macroscopic intralesional fat. This lesions are nonspecific and differential diagnosis includes but not limited to are multiple dysplastic nodules, multiple hepatic adenomas, metastases, etc. Considering the lack of true arterial phase and this lesions appear relatively hypoenhancing on the delayed/equilibrium phase images, multifocal HCC also remains in the differential diagnosis. Further evaluation with tissue sampling is recommended. Also short-term continued follow-up examination is recommended to document the growth pattern. The middle hepatic vein is visualized and exhibit no thrombosis. The left and right hepatic veins are only seen near the  central portion/confluence and appears patent. There is diminutive portal vein which is otherwise patent. No intrahepatic or extrahepatic bile duct dilatation. No choledocholithiasis. The gallbladder is surgically absent. Pancreas: There are multiple, scattered 1 cm or smaller T2 hyperintense nonenhancing foci throughout the pancreas (marked with electronic arrow sign on series 29). The largest 2 adjacent lesions in the uncinate process measure 6 x 9 and 8 x 9 mm (series 34, images 13 and 15). 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 Follow-up examination is recommended in 2 years. The pancreas is otherwise within normal limits. No peripancreatic fat stranding. Main pancreatic duct is not dilated. Spleen: Spleen is enlarged measuring up to 6.9 x 14.5 cm orthogonally on coronal plane. No focal lesion. Adrenals/Urinary Tract: Unremarkable adrenal glands. No hydroureteronephrosis. No suspicious renal mass. Stomach/Bowel: Visualized portions within the abdomen are unremarkable. No disproportionate dilation of bowel loops. Vascular/Lymphatic: No pathologically enlarged lymph nodes identified. No abdominal aortic aneurysm demonstrated. There is trace ascites mainly in the perihepatic region. There are extensive venous collaterals in the left upper quadrant mainly surrounding the spleen and in the left para-aortic region, including splenorenal shunts, compatible with sequela of portal hypertension. Other:  None. Musculoskeletal: No suspicious bone lesions identified. IMPRESSION: 1. There are innumerable liver lesions, as described above. These are nonspecific and differential diagnosis includes but not limited to are dysplastic nodules, hepatic adenomas, multifocal HCC, metastases, etc. Further evaluation with tissue sampling is recommended. 2. 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 Follow-up examination is recommended in 2 years. 3. There is cirrhotic liver configuration with stigmata of portal hypertension including splenomegaly and trace ascites. 4. Multiple other nonacute observations, as described above.  Electronically Signed   By: Ree Molt M.D.   On: 12/10/2023 09:15   MR 3D Recon At Scanner Result Date: 12/10/2023 CLINICAL DATA:  Gallbladder/biliary cancer, staging Liver Mass. EXAM: MRI ABDOMEN WITHOUT AND WITH CONTRAST (INCLUDING MRCP) TECHNIQUE: Multiplanar multisequence MR imaging of the abdomen was performed both before and after the administration of intravenous contrast. Heavily T2-weighted images of the biliary and pancreatic ducts were obtained, and three-dimensional MRCP images were rendered by post processing. CONTRAST:  10mL GADAVIST  GADOBUTROL  1 MMOL/ML IV SOLN COMPARISON:  Ultrasound abdomen from 12/09/2023. FINDINGS: Lower chest: Unremarkable MR appearance to the lung bases. No pleural effusion. No pericardial effusion. Normal heart size. Hepatobiliary: There is extensive liver surface irregularity/nodularity, compatible with cirrhosis. There are innumerable T1 hyperintense and T2 hypointense masses throughout the liver. The largest such mass is in the right hepatic dome, segments 8/4 a measuring up to 6.7 x 8.7 cm (series 7, image 34). The second largest lesion is in the right hepatic lobe, subcapsular segments 6/7 measuring 6.3 x 7.8 cm (series 7, image 47). This lesion corresponds to the lesion described on the recent ultrasound. Please note the first set of postcontrast images are little delayed and there are no true arterial phase images. These lesions show moderate enhancement on the first/30 second phase images. No washout of contrast on the delayed/equilibrium phase images. The lesion exhibits smooth thin T2 hypointense rim which is nonenhancing on the arterial phase images but exhibits enhancement on the 3  minute delayed images. There is no diffusion restriction on high B value images. No micro or macroscopic intralesional fat. This lesions are nonspecific and differential diagnosis includes but not limited to are multiple dysplastic nodules, multiple hepatic adenomas, metastases, etc. Considering the lack of true arterial phase and this lesions appear relatively hypoenhancing on the delayed/equilibrium phase images, multifocal HCC also remains in the differential diagnosis. Further evaluation with tissue sampling is recommended. Also short-term continued follow-up examination is recommended to document the growth pattern. The middle hepatic vein is visualized and exhibit no thrombosis. The left and right hepatic veins are only seen near the central portion/confluence and appears patent. There is diminutive portal vein which is otherwise patent. No intrahepatic or extrahepatic bile duct dilatation. No choledocholithiasis. The gallbladder is surgically absent. Pancreas: There are multiple, scattered 1 cm or smaller T2 hyperintense nonenhancing foci throughout the pancreas (marked with electronic arrow sign on series 29). The largest 2 adjacent lesions in the uncinate process measure 6 x 9 and 8 x 9 mm (series 34, images 13 and 15). 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 Follow-up examination is recommended in 2 years. The pancreas is otherwise within normal limits. No peripancreatic fat stranding. Main pancreatic duct is not dilated. Spleen: Spleen is enlarged measuring up to 6.9 x 14.5 cm orthogonally on coronal plane. No focal lesion. Adrenals/Urinary Tract: Unremarkable adrenal glands. No hydroureteronephrosis. No suspicious renal mass. Stomach/Bowel: Visualized portions within the abdomen are unremarkable. No disproportionate dilation of bowel loops. Vascular/Lymphatic: No pathologically enlarged lymph nodes identified. No abdominal aortic  aneurysm demonstrated. There is trace ascites mainly in the perihepatic region. There are extensive venous collaterals in the left upper quadrant mainly surrounding the spleen and in the left para-aortic region, including splenorenal shunts, compatible with sequela of portal hypertension. Other:  None. Musculoskeletal: No suspicious bone lesions identified. IMPRESSION: 1. There are innumerable liver lesions, as described above. These are nonspecific and differential diagnosis includes but not limited to  are dysplastic nodules, hepatic adenomas, multifocal HCC, metastases, etc. Further evaluation with tissue sampling is recommended. 2. 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 Follow-up examination is recommended in 2 years. 3. There is cirrhotic liver configuration with stigmata of portal hypertension including splenomegaly and trace ascites. 4. Multiple other nonacute observations, as described above. Electronically Signed   By: Ree Molt M.D.   On: 12/10/2023 09:15   US  Abdomen Limited RUQ (LIVER/GB) Result Date: 12/09/2023 CLINICAL DATA:  Abnormal LFTs. EXAM: ULTRASOUND ABDOMEN LIMITED RIGHT UPPER QUADRANT COMPARISON:  Ultrasound dated 06/13/2011. FINDINGS: Gallbladder: Cholecystectomy. Common bile duct: Diameter: 9 mm Liver: The liver demonstrates a heterogeneous echogenicity and coarsened echotexture. There is irregularity of the liver contour in keeping with cirrhosis. Ill-defined hypoechoic lesion in the posterior right lobe of the liver measures 9.0 x 6.5 x 6.7 cm. Further evaluation with multiphasic CT or MRI recommended. There is reversal of flow in the main portal vein. Other: None. IMPRESSION: 1. Cholecystectomy. 2. Cirrhosis. Solid-appearing mass in the right lobe of the liver. Further evaluation with MRI without and with contrast recommended. 3. Hepatofugal flow in the main portal  vein. Electronically Signed   By: Vanetta Chou M.D.   On: 12/09/2023 15:16   DG Chest 2 View Result Date: 12/09/2023 EXAM: 2 VIEW(S) XRAY OF THE CHEST 12/09/2023 11:53:00 AM COMPARISON: 06/13/2008 chest radiograph. CLINICAL HISTORY: SOB. Pt arrived via POV from home c/o bilateral lower extremity swelling for past couple of months. FINDINGS: LUNGS AND PLEURA: Diffuse interstitial opacities. No focal pulmonary opacity. No pleural effusion. No pneumothorax. HEART AND MEDIASTINUM: No acute abnormality of the cardiac and mediastinal silhouettes. BONES AND SOFT TISSUES: No acute osseous abnormality. IMPRESSION: 1. Diffuse interstitial opacities, likely pulmonary edema. Electronically signed by: Manford Cummins MD 12/09/2023 12:46 PM EDT RP Workstation: [id number]    Labs:  CBC: Recent Labs    12/09/23 1214  WBC 6.9  HGB 12.6  HCT 38.7  PLT 429*    COAGS: Recent Labs    12/10/23 1149  INR 1.2    BMP: Recent Labs    12/09/23 1214 12/10/23 0526 12/11/23 0431 12/12/23 0441  NA 137 138 139 133*  K 3.7 3.4* 4.4 3.9  CL 96* 94* 97* 92*  CO2 27 28 30 29   GLUCOSE 95 74 74 87  BUN 18 18 21  25*  CALCIUM 8.6* 8.4* 8.4* 8.2*  CREATININE 1.03* 0.95 1.21* 1.15*  GFRNONAA 60* >60 49* 52*    LIVER FUNCTION TESTS: Recent Labs    12/09/23 1214 12/10/23 0526 12/11/23 0431 12/12/23 0441  BILITOT 11.7* 10.7* 10.1* 10.9*  AST 199* 181* 199* 218*  ALT 63* 54* 57* 59*  ALKPHOS 758* 648* 658* 622*  PROT 6.4* 5.5* 5.4* 5.3*  ALBUMIN 2.1* 1.8* 1.9* 1.8*    TUMOR MARKERS: No results for input(s): AFPTM, CEA, CA199, CHROMGRNA in the last 8760 hours.  Assessment and Plan: 68 y.o. female with PMH sig for arthritis, obesity, fatty liver, prior cholecystectomy 2013 who was recently admitted to [facility] for jaundice, elevated LFT's, LE edema, dyspnea. She was subsequently found to have CHF/pulmonary edema and MRI abd findings revealing:   1. There are innumerable liver lesions, as described  above. These are nonspecific and differential diagnosis includes but not limited to are dysplastic nodules, hepatic adenomas, multifocal HCC, metastases, etc. Further evaluation with tissue sampling is recommended. 2. 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 Follow-up examination is recommended in 2 years. 3. There is cirrhotic liver configuration with stigmata of portal hypertension including splenomegaly and trace ascites  Normal CEA, AFP,neg hepatitis panel; no prior hx malignancy  She is scheduled today as an outpatient  for image guided liver lesion biopsy for further evaluation.Risks and benefits of procedure was discussed with the patient  including, but not limited to bleeding, infection, damage to adjacent structures or low yield requiring additional tests.  All of the questions were answered and there is agreement to proceed.  Consent signed and in chart.    Thank you for allowing our service to participate in LEATHER ESTIS 's care.  Electronically Signed: D. Franky Rakers, PA-C   12/20/2023, 12:23 PM      I spent a total of   25 minutes  in face to face in clinical consultation, greater than 50% of which was counseling/coordinating care for image guided liver lesion biopsy

## 2023-12-20 NOTE — Sedation Documentation (Signed)
 RN Smrithi Pigford pulled 2 mg Versed  and 100 mcg Fentanyl  in US  room. Pt. Received 2 mg Versed  and 50 mcg Fentanyl  throughout the procedure. Kenyotta Dorfman RN wasted 50mcg Fentanyl  with Research officer, [employer].

## 2023-12-23 LAB — SURGICAL PATHOLOGY

## 2023-12-24 ENCOUNTER — Telehealth: Payer: Self-pay | Admitting: Nurse Practitioner

## 2023-12-25 NOTE — Telephone Encounter (Signed)
  Pt called our office reporting she had reviewed her recent biopsy pathology report but didn't understand what it meant. I explained to her and her husband that the test was positive for cancer but that since I'm not an oncologist that I could not educate them regarding treatment options. I was informed by them that she has an appointment on Monday Aug 25 with Oncology and Wed Aug 27 with GI. I encouraged her to go to both appointments as scheduled.

## 2023-12-26 DIAGNOSIS — C220 Liver cell carcinoma: Secondary | ICD-10-CM | POA: Insufficient documentation

## 2023-12-26 NOTE — Assessment & Plan Note (Addendum)
 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  With the local hepatocellular carcinoma, regional therapies might be an option including Y90 or radiation.  Will refer to IR for assessment. -Considering patient has innumerable lesions, if regional therapy is not an option then would consider systemic immunotherapy.  She would need an EGD prior to starting atezolizumab/ bevacizumab.  If she has Varices, will consider tremelimumab/nivolumab. - Labs reviewed today: CMP: Mild hyponatremia, elevated creatinine.  Elevated alkaline phosphatase but trending down.  Elevated LFTs.  Total bilirubin: 10.9.  CBC: Hemoglobin: 11.6, platelets: Normal.  PT: 14.7, INR: 1.1  Return to clinic after evaluation by IR for further management

## 2023-12-26 NOTE — Progress Notes (Signed)
 Hematology-Oncology Clinic Note  Burdine, Elspeth BRAVO, MD   Reason for Referral: Moderately differentiated hepatocellular carcinoma  Oncology History: I have reviewed her chart and materials related to her cancer extensively and collaborated history with the patient. Summary of oncologic history is as follows:  Diagnosis: Moderately differentiated hepatocellular carcinoma  -Presentation: Significant bilateral lower extremity edema -12/09/2023: US  of abdomen: Cirrhosis. Solid-appearing mass in the right lobe of the liver. -12/10/2023: MRI/MRCP: Innumerable liver lesions. The largest such mass is in the right hepatic dome, measuring up to 6.7 x 8.7 cm. The second largest lesion is in the right hepatic lobe, measuring 6.3 x 7.8 cm. 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 Cirrhotic liver configuration with stigmata of portal hypertension including splenomegaly and trace ascites. -12/10/2023: AFP 3.2. CEA 3.2. AST 181. ALT 54. Alkaline Phosphatase 648. Hepatitis panel: Negative -12/20/2023: Left lobe mass of liver biopsy.  Pathology: Moderately differentiated hepatocellular carcinoma. The lesional cells are positive for arginase-1 and HepPar-1.    History of Presenting Illness: [name] 68 y.o. female is referred by Pearlean Manus, MD for moderately differentiated hepatocellular carcinoma.   Patient has a medical history of liver cirrhosis secondary to NASH, fatty liver disease, and CHF. She has a surgical history of cholecystectomy.   Chanie was admitted to the hospital from 12/09/2023 to 12/12/2023 for liver cirrhosis with fluid overload and liver mass. US  of the abdomen showed: Cirrhosis. Solid-appearing mass in the right lobe of the liver.   MRI abdomen/MRCP showed: Innumerable liver lesions. The largest such mass is in the right hepatic dome, measuring up to 6.7 x 8.7 cm. The  second largest lesion is in the right hepatic lobe, measuring 6.3 x 7.8 cm. 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 Cirrhotic liver configuration with stigmata of portal hypertension including splenomegaly and trace ascites.  During her hospitalization, a history of liver cirrhosis dating back to early 2013 and fatty liver disease was noted. Hepatitis profile was negative for hep A, hep B, and hep C. AFP was 3.2 and CEA was 3.2. LFT's during her hospitalization were elevated with AST at 181, ALT at 54, and alkaline phosphatase at 648. Total bilirubin was elevated at 10.7. To treat her fluid overload, including significant lower extremity edema, Aleksia was given IV lasix  and p.o. Aldactone . She was also discharged with p.o. lasix  and Aldactone .   On 12/20/2023, she underwent liver mass biopsy with IR as outpatient. Pathology of left lobe liver mass revealed: moderately differentiated hepatocellular carcinoma. The lesional cells are positive for arginase-1 and HepPar-1.   Patient today was accompanied with her husband, nephew and his wife.  She reported that her only complaint today was fatigue and she is not able to be as functional as she was before.  She also has some jaundice that she noticed in January.  Her appetite is very low and she has to force food down.  She has not lost any weight.  She also reports severe itching of her skin.  She is a non-smoker, occasional alcohol drinker.  No family history of cancer.  She lives with her husband.   Medical History: Past Medical History:  Diagnosis Date   Arthritis    Obesity    Vaginal atrophy 05/27/2015   Vaginal irritation 05/27/2015    Surgical history: Past Surgical History:  Procedure Laterality Date   CATARACT EXTRACTION  W/PHACO Right 06/28/2017   Procedure: CATARACT EXTRACTION PHACO AND INTRAOCULAR LENS PLACEMENT (IOC);  Surgeon: Perley Hamilton, MD;  Location: [facility];  Service: Ophthalmology;  Laterality: Right;  CDE: 7.94   CHOLECYSTECTOMY  07/09/2011   Procedure: LAPAROSCOPIC CHOLECYSTECTOMY WITH INTRAOPERATIVE CHOLANGIOGRAM;  Surgeon: Camellia CHRISTELLA Blush, MD,FACS;  Location: [facility];  Service: General;  Laterality: N/A;  laparoscopic cholecystectomy with cholangiogram   JOINT REPLACEMENT     BILATERAL    TOTAL KNEE ARTHROPLASTY  2011     Allergies:  is allergic to penicillins.  Medications:  Current Outpatient Medications  Medication Sig Dispense Refill   carvedilol  (COREG ) 3.125 MG tablet Take 1 tablet (3.125 mg total) by mouth 2 (two) times daily. (Patient taking differently: Take by mouth 2 (two) times daily.) 60 tablet 11   cholestyramine  (QUESTRAN ) 4 g packet Take 1 packet (4 g total) by mouth 2 (two) times daily. 60 each 12   Cyanocobalamin (VITAMIN B-12 PO) Take by mouth.     furosemide  (LASIX ) 40 MG tablet Take 1 tablet (40 mg total) by mouth daily. 30 tablet 11   lactulose  (CHRONULAC ) 10 GM/15ML solution Take 15 mLs (10 g total) by mouth daily. 236 mL 4   sodium chloride  (OCEAN) 0.65 % SOLN nasal spray Place 1 spray into both nostrils as needed for congestion.     spironolactone  (ALDACTONE ) 25 MG tablet Take 1 tablet (25 mg total) by mouth daily. 30 tablet 11   VITAMIN D PO Take by mouth.     No current facility-administered medications for this visit.    Review of Systems: Constitutional: Denies fevers, chills or abnormal night sweats Eyes: Denies blurriness of vision, double vision or watery eyes Ears, nose, mouth, throat, and face: Denies mucositis or sore throat Respiratory: Denies cough, dyspnea or wheezes Cardiovascular: Denies palpitation, chest discomfort or lower extremity swelling Gastrointestinal:  Denies nausea, heartburn or change in bowel habits Skin: Denies abnormal skin rashes Lymphatics: Denies new lymphadenopathy or easy bruising Neurological:Denies numbness, tingling or new  weaknesses Behavioral/Psych: Mood is stable, no new changes  All other systems were reviewed with the patient and are negative.  Physical Examination: ECOG PERFORMANCE STATUS: 2 - Symptomatic, <50% confined to bed  Vitals:   12/27/23 1108  BP: 123/69  Pulse: 63  Resp: 16  Temp: 98 F (36.7 C)  SpO2: 95%   Filed Weights   12/27/23 1108  Weight: 265 lb (120.2 kg)    GENERAL:alert, no distress and comfortable, morbidly obese in a wheel chair SKIN: Yellow discoloration. No rashes.  EYES: normal, conjunctiva are pink and non-injected, sclera Jaundiced LYMPH:  no palpable lymphadenopathy in the cervical, axillary or inguinal LUNGS: clear to auscultation and percussion with normal breathing effort HEART: regular rate & rhythm and no murmurs and no lower extremity edema ABDOMEN:abdomen soft, non-tender and normal bowel sounds Musculoskeletal:no cyanosis of digits and no clubbing  PSYCH: alert & oriented x 3 with fluent speech NEURO: no focal motor/sensory deficits   Laboratory Data: I have reviewed the data as listed Lab Results  Component Value Date   WBC 10.4 12/20/2023   HGB 11.6 (L) 12/20/2023   HCT 34.8 (L) 12/20/2023   MCV 92.1 12/20/2023   PLT 383 12/20/2023   Recent Labs    12/10/23 0526 12/11/23 0431 12/12/23 0441  NA 138 139 133*  K 3.4* 4.4 3.9  CL 94* 97* 92*  CO2 28 30 29   GLUCOSE 74 74 87  BUN 18 21 25*  CREATININE 0.95 1.21*  1.15*  CALCIUM 8.4* 8.4* 8.2*  GFRNONAA >60 49* 52*  PROT 5.5* 5.4* 5.3*  ALBUMIN 1.8* 1.9* 1.8*  AST 181* 199* 218*  ALT 54* 57* 59*  ALKPHOS 648* 658* 622*  BILITOT 10.7* 10.1* 10.9*    Latest Reference Range & Units 12/20/23 12:05  Prothrombin Time 11.4 - 15.2 seconds 14.7  INR 0.8 - 1.2  1.1    Latest Reference Range & Units 12/09/23 13:43  Hep A Ab, IgM NON REACTIVE  NON REACTIVE  Hepatitis B Surface Ag NON REACTIVE  NON REACTIVE  Hep B Core Ab, IgM NON REACTIVE  NON REACTIVE  HCV Ab NON REACTIVE  NON REACTIVE     Latest Reference Range & Units 12/10/23 05:26  AFP, Serum, Tumor Marker 0.0 - 9.2 ng/mL 3.2  CEA 0.0 - 4.7 ng/mL 3.2   Radiographic Studies: I have personally reviewed the radiological images as listed and agreed with the findings in the report.  Narrative & Impression  CLINICAL DATA:  Gallbladder/biliary cancer, staging Liver Mass.   EXAM: MRI ABDOMEN WITHOUT AND WITH CONTRAST (INCLUDING MRCP)   TECHNIQUE: Multiplanar multisequence MR imaging of the abdomen was performed both before and after the administration of intravenous contrast. Heavily T2-weighted images of the biliary and pancreatic ducts were obtained, and three-dimensional MRCP images were rendered by post processing.   CONTRAST:  10mL GADAVIST  GADOBUTROL  1 MMOL/ML IV SOLN   COMPARISON:  Ultrasound abdomen from 12/09/2023.   FINDINGS: Lower chest: Unremarkable MR appearance to the lung bases. No pleural effusion. No pericardial effusion. Normal heart size.   Hepatobiliary: There is extensive liver surface irregularity/nodularity, compatible with cirrhosis. There are innumerable T1 hyperintense and T2 hypointense masses throughout the liver. The largest such mass is in the right hepatic dome, segments 8/4 a measuring up to 6.7 x 8.7 cm (series 7, image 34). The second largest lesion is in the right hepatic lobe, subcapsular segments 6/7 measuring 6.3 x 7.8 cm (series 7, image 47). This lesion corresponds to the lesion described on the recent ultrasound.   Please note the first set of postcontrast images are little delayed and there are no true arterial phase images. These lesions show moderate enhancement on the first/30 second phase images. No washout of contrast on the delayed/equilibrium phase images. The lesion exhibits smooth thin T2 hypointense rim which is nonenhancing on the arterial phase images but exhibits enhancement on the 3 minute delayed images. There is no diffusion restriction on high B  value images. No micro or macroscopic intralesional fat. This lesions are nonspecific and differential diagnosis includes but not limited to are multiple dysplastic nodules, multiple hepatic adenomas, metastases, etc. Considering the lack of true arterial phase and this lesions appear relatively hypoenhancing on the delayed/equilibrium phase images, multifocal HCC also remains in the differential diagnosis. Further evaluation with tissue sampling is recommended. Also short-term continued follow-up examination is recommended to document the growth pattern.   The middle hepatic vein is visualized and exhibit no thrombosis. The left and right hepatic veins are only seen near the central portion/confluence and appears patent. There is diminutive portal vein which is otherwise patent.   No intrahepatic or extrahepatic bile duct dilatation. No choledocholithiasis. The gallbladder is surgically absent.   Pancreas: There are multiple, scattered 1 cm or smaller T2 hyperintense nonenhancing foci throughout the pancreas (marked with electronic arrow sign on series 29). The largest 2 adjacent lesions in the uncinate process measure 6 x 9 and 8 x 9 mm (series 34, images 13  and 15). 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 Follow-up examination is recommended in 2 years. The pancreas is otherwise within normal limits. No peripancreatic fat stranding. Main pancreatic duct is not dilated.   Spleen: Spleen is enlarged measuring up to 6.9 x 14.5 cm orthogonally on coronal plane. No focal lesion.   Adrenals/Urinary Tract: Unremarkable adrenal glands. No hydroureteronephrosis. No suspicious renal mass.   Stomach/Bowel: Visualized portions within the abdomen are unremarkable. No disproportionate dilation of bowel loops.   Vascular/Lymphatic: No pathologically enlarged lymph nodes identified. No abdominal aortic aneurysm  demonstrated. There is trace ascites mainly in the perihepatic region. There are extensive venous collaterals in the left upper quadrant mainly surrounding the spleen and in the left para-aortic region, including splenorenal shunts, compatible with sequela of portal hypertension.   Other:  None.   Musculoskeletal: No suspicious bone lesions identified.   IMPRESSION: 1. There are innumerable liver lesions, as described above. These are nonspecific and differential diagnosis includes but not limited to are dysplastic nodules, hepatic adenomas, multifocal HCC, metastases, etc. Further evaluation with tissue sampling is recommended. 2. 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 Follow-up examination is recommended in 2 years. 3. There is cirrhotic liver configuration with stigmata of portal hypertension including splenomegaly and trace ascites. 4. Multiple other nonacute observations, as described above.      ASSESSMENT & PLAN:  Patient is a 68 y.o. 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  With the local hepatocellular carcinoma, regional therapies might be an option including Y90 or radiation.  Will refer to IR for assessment. -Considering patient has innumerable lesions, if regional therapy is not an option then would consider systemic immunotherapy.  She would need an EGD prior to starting atezolizumab/ bevacizumab.  If she has Varices, will  consider tremelimumab/nivolumab. - Labs reviewed today: CMP: Mild hyponatremia, elevated creatinine.  Elevated alkaline phosphatase but trending down.  Elevated LFTs.  Total bilirubin: 10.9.  CBC: Hemoglobin: 11.6, platelets: Normal.  PT: 14.7, INR: 1.1  Return to clinic after evaluation by IR for further management Jaundice Patient has elevated bilirubin, elevated ALP. MRCP consistent with innumerable liver lesions and probable IPMN Reports severe itching  - Currently taking lactulose  and rifaximin -Will prescribe cholestyramine  for itching -Patient has an appointment with GI on Wednesday.    Orders Placed This Encounter  Procedures   CT Chest W Contrast    Standing Status:   Future    Expected Date:   01/03/2024    Expiration Date:   12/26/2024    If indicated for the ordered procedure, I authorize the administration of contrast media per Radiology protocol:   Yes    Does the patient have a contrast media/X-ray dye allergy?:   No    Preferred imaging location?:   [facility]    Release to patient:   Immediate [1]   IR Radiologist Eval & Mgmt    Standing Status:   Future    Expiration Date:   12/26/2024  Reason for Exam (SYMPTOM  OR DIAGNOSIS REQUIRED):   hepatocellular carcinoma Y90 evaluation    Preferred Imaging Location?:   [facility]    Release to patient:   Immediate    The total time spent in the appointment was 60 minutes encounter with patients including review of chart and various tests results, discussions about plan of care and coordination of care plan   All questions were answered. The patient knows to call the clinic with any problems, questions or concerns. No barriers to learning was detected.  Mickiel Dry, MD 8/25/20252:53 PM

## 2023-12-27 ENCOUNTER — Inpatient Hospital Stay: Attending: Oncology | Admitting: Oncology

## 2023-12-27 ENCOUNTER — Inpatient Hospital Stay

## 2023-12-27 ENCOUNTER — Encounter: Payer: Self-pay | Admitting: Oncology

## 2023-12-27 VITALS — BP 123/69 | HR 63 | Temp 98.0°F | Resp 16 | Ht 65.0 in | Wt 265.0 lb

## 2023-12-27 DIAGNOSIS — Z880 Allergy status to penicillin: Secondary | ICD-10-CM | POA: Diagnosis not present

## 2023-12-27 DIAGNOSIS — C220 Liver cell carcinoma: Secondary | ICD-10-CM | POA: Insufficient documentation

## 2023-12-27 DIAGNOSIS — I509 Heart failure, unspecified: Secondary | ICD-10-CM | POA: Diagnosis not present

## 2023-12-27 DIAGNOSIS — R17 Unspecified jaundice: Secondary | ICD-10-CM | POA: Insufficient documentation

## 2023-12-27 DIAGNOSIS — Z79899 Other long term (current) drug therapy: Secondary | ICD-10-CM | POA: Insufficient documentation

## 2023-12-27 DIAGNOSIS — E871 Hypo-osmolality and hyponatremia: Secondary | ICD-10-CM | POA: Insufficient documentation

## 2023-12-27 DIAGNOSIS — I110 Hypertensive heart disease with heart failure: Secondary | ICD-10-CM | POA: Diagnosis not present

## 2023-12-27 MED ORDER — CHOLESTYRAMINE 4 G PO PACK
4.0000 g | PACK | Freq: Two times a day (BID) | ORAL | 12 refills | Status: DC
Start: 2023-12-27 — End: 2024-01-25

## 2023-12-27 NOTE — Patient Instructions (Signed)
 [facility]  Discharge Instructions  You were seen and examined today by Dr. Davonna. Dr. Davonna is a medical oncologist, meaning that she specializes in the treatment of cancer diagnoses. Dr. Davonna discussed your past medical history, family history of cancers, and the events that led to you being here today.  You were referred to Dr. Davonna for a new diagnosis of hepatocellular carcinoma. As of right now, it appears to be localized to your liver.  Dr. Davonna will order a CT scan or your chest to ensure there is no spread of the cancer to your chest.  We will send you to Interventional Radiology to see if a targeted procedure known as Y90 is an option for you. If you cannot be treated by IR, we would then discuss the role for immunotherapy. Interventional Radiology will contact you directly to schedule your evaluation.  Follow-up as scheduled.  Thank you for choosing [facility] to provide your oncology and hematology care.   To afford each patient quality time with our provider, please arrive at least 15 minutes before your scheduled appointment time. You may need to reschedule your appointment if you arrive late (10 or more minutes). Arriving late affects you and other patients whose appointments are after yours.  Also, if you miss three or more appointments without notifying the office, you may be dismissed from the clinic at the provider's discretion.    Again, thank you for choosing [facility].  Our hope is that these requests will decrease the amount of time that you wait before being seen by our physicians.   If you have a lab appointment with the Cancer Center - please note that after April 8th, all labs will be drawn in the cancer center.  You do not have to check in or register with the main entrance as you have in the past but will complete your check-in at the cancer center.             _____________________________________________________________  Should you have questions after your visit to [facility], please contact our office at [phone] and follow the prompts.  Our office hours are 8:00 a.m. to 4:30 p.m. Monday - Thursday and 8:00 a.m. to 2:30 p.m. Friday.  Please note that voicemails left after 4:00 p.m. may not be returned until the following business day.  We are closed weekends and all major holidays.  You do have access to a nurse 24-7, just call the main number to the clinic [phone] and do not press any options, hold on the line and a nurse will answer the phone.    For prescription refill requests, have your pharmacy contact our office and allow 72 hours.    Masks are no longer required in the cancer centers. If you would like for your care team to wear a mask while they are taking care of you, please let them know. You may have one support person who is at least 67 years old accompany you for your appointments.

## 2023-12-27 NOTE — Assessment & Plan Note (Signed)
 Patient has elevated bilirubin, elevated ALP. MRCP consistent with innumerable liver lesions and probable IPMN Reports severe itching  - Currently taking lactulose  and rifaximin -Will prescribe cholestyramine  for itching -Patient has an appointment with GI on Wednesday.

## 2023-12-29 ENCOUNTER — Encounter: Payer: Self-pay | Admitting: Internal Medicine

## 2023-12-29 ENCOUNTER — Ambulatory Visit (INDEPENDENT_AMBULATORY_CARE_PROVIDER_SITE_OTHER): Admitting: Internal Medicine

## 2023-12-29 ENCOUNTER — Telehealth: Payer: Self-pay | Admitting: *Deleted

## 2023-12-29 VITALS — BP 133/77 | HR 65 | Temp 97.1°F | Ht 65.0 in | Wt 264.5 lb

## 2023-12-29 DIAGNOSIS — C220 Liver cell carcinoma: Secondary | ICD-10-CM | POA: Diagnosis not present

## 2023-12-29 DIAGNOSIS — L299 Pruritus, unspecified: Secondary | ICD-10-CM

## 2023-12-29 DIAGNOSIS — K7460 Unspecified cirrhosis of liver: Secondary | ICD-10-CM | POA: Diagnosis not present

## 2023-12-29 DIAGNOSIS — R188 Other ascites: Secondary | ICD-10-CM

## 2023-12-29 MED ORDER — PANTOPRAZOLE SODIUM 40 MG PO TBEC: 40.0000 mg | DELAYED_RELEASE_TABLET | Freq: Every day | ORAL | 11 refills | Status: AC

## 2023-12-29 NOTE — Progress Notes (Signed)
 Primary Care Physician:  Lari Elspeth BRAVO, MD Primary Gastroenterologist:  Dr. Cindie  Chief Complaint  Patient presents with   Hospitalization Follow-up    Patient here today for a follow up from recent hospitalization, on 12/09/2023 due to cirrhosis, and possibel HCC.    HPI:   [name] is a 68 y.o. female who presents to clinic today for hospital follow-up visit.  Reports that she is not seeing a primary care physician in 5 or 6 years.  Presented to [facility] with acute respiratory failure in the setting of significant fluid overload.  Blood work in ER showed significantly abnormal LFTs including T. bili 11.7, alk phos 758, ALT 63, AST 199, albumin 2.1.  RUQ US  12/09/23: Cirrhosis. Solid-appearing mass in the right lobe of the liver.    MRI abdomen/MRCP 12/10/23: Innumerable liver lesions. The largest such mass is in the right hepatic dome, measuring up to 6.7 x 8.7 cm. The second largest lesion is in the right hepatic lobe, measuring 6.3 x 7.8 cm. 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 Cirrhotic liver configuration with stigmata of portal hypertension including splenomegaly and trace ascites.  Hepatitis profile was negative for hep A, hep B, and hep C. AFP was 3.2 and CEA was 3.2   Liver biopsy 12/20/2023: Hepatocellular carcinoma, moderately differentiated.  Was diuresed aggressively with improvement in her edema.  Currently taking furosemide  40 mg daily, spironolactone  25 mg daily.  Has met with oncology.  Scheduled for CT chest to rule out metastases.  Upon further review, looks like she had cirrhotic findings on the CT abdomen pelvis back in February 2013.  Unfortunately she has not seen a physician in 5 to 6 years.  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  No history of illicit drug use or chronic alcohol use.  No family history of cancer.  Today, states she is  doing okay given the circumstances.  Reports lack of energy.  Was having significant itching though was recently started on cholestyramine  which has helped this immensely.    Past Medical History:  Diagnosis Date   Arthritis    Obesity    Vaginal atrophy 05/27/2015   Vaginal irritation 05/27/2015    Past Surgical History:  Procedure Laterality Date   CATARACT EXTRACTION W/PHACO Right 06/28/2017   Procedure: CATARACT EXTRACTION PHACO AND INTRAOCULAR LENS PLACEMENT (IOC);  Surgeon: Perley Hamilton, MD;  Location: [facility];  Service: Ophthalmology;  Laterality: Right;  CDE: 7.94   CHOLECYSTECTOMY  07/09/2011   Procedure: LAPAROSCOPIC CHOLECYSTECTOMY WITH INTRAOPERATIVE CHOLANGIOGRAM;  Surgeon: Camellia CHRISTELLA Blush, MD,FACS;  Location: [facility];  Service: General;  Laterality: N/A;  laparoscopic cholecystectomy with cholangiogram   JOINT REPLACEMENT     BILATERAL    TOTAL KNEE ARTHROPLASTY  2011    Current Outpatient Medications  Medication Sig Dispense Refill   carvedilol  (COREG ) 3.125 MG tablet Take 1 tablet (3.125 mg total) by mouth 2 (two) times daily. 60 tablet 11   cholestyramine  (QUESTRAN ) 4 g packet Take 1 packet (4 g total) by mouth 2 (two) times daily. 60 each 12   Cyanocobalamin (VITAMIN B-12 PO) Take by mouth. (Patient taking differently: Take by mouth daily at 6 (six) AM.)     furosemide  (LASIX ) 40 MG tablet Take 1 tablet (40 mg total) by mouth daily. 30 tablet 11   lactulose  (CHRONULAC ) 10 GM/15ML solution Take 15 mLs (10 g total) by mouth daily. (Patient taking  differently: Take 10 g by mouth daily. 1 full tablespoon, and some time increases it to two Tablespoons depending on stool output.) 236 mL 4   pantoprazole  (PROTONIX ) 40 MG tablet Take 1 tablet (40 mg total) by mouth daily. 30 tablet 11   sodium chloride  (OCEAN) 0.65 % SOLN nasal spray Place 1 spray into both nostrils as needed for congestion.     spironolactone  (ALDACTONE ) 25 MG tablet Take 1 tablet (25 mg total) by mouth daily. 30  tablet 11   VITAMIN D PO Take by mouth. (Patient taking differently: Take by mouth daily at 6 (six) AM.)     No current facility-administered medications for this visit.    Allergies as of 12/29/2023 - Review Complete 12/29/2023  Allergen Reaction Noted   Penicillins Rash 06/13/2011    Family History  Problem Relation Age of Onset   Cancer Mother        lung   Diabetes Father    Heart attack Paternal Grandmother    Heart attack Paternal Grandfather    Diabetes Brother     Social History   Socioeconomic History   Marital status: Married    Spouse name: Not on file   Number of children: Not on file   Years of education: Not on file   Highest education level: Not on file  Occupational History   Not on file  Tobacco Use   Smoking status: Never    Passive exposure: Never   Smokeless tobacco: Never  Vaping Use   Vaping status: Never Used  Substance and Sexual Activity   Alcohol use: No   Drug use: No   Sexual activity: Yes    Birth control/protection: Post-menopausal  Other Topics Concern   Not on file  Social History Narrative   Not on file   Social Drivers of Health   Financial Resource Strain: Not on file  Food Insecurity: No Food Insecurity (12/09/2023)   Hunger Vital Sign    Worried About Running Out of Food in the Last Year: Never true    Ran Out of Food in the Last Year: Never true  Transportation Needs: No Transportation Needs (12/09/2023)   PRAPARE - Administrator, Civil Service (Medical): No    Lack of Transportation (Non-Medical): No  Physical Activity: Not on file  Stress: Not on file  Social Connections: Socially Integrated (12/09/2023)   Social Connection and Isolation Panel    Frequency of Communication with Friends and Family: Twice a week    Frequency of Social Gatherings with Friends and Family: Twice a week    Attends Religious Services: More than 4 times per year    Active Member of [employer] or Organizations: Yes    Attends Museum/gallery exhibitions officer: More than 4 times per year    Marital Status: Married  Catering manager Violence: Not At Risk (12/09/2023)   Humiliation, Afraid, Rape, and Kick questionnaire    Fear of Current or Ex-Partner: No    Emotionally Abused: No    Physically Abused: No    Sexually Abused: No    Subjective: Review of Systems  Constitutional:  Negative for chills and fever.  HENT:  Negative for congestion and hearing loss.   Eyes:  Negative for blurred vision and double vision.  Respiratory:  Negative for cough and shortness of breath.   Cardiovascular:  Negative for chest pain and palpitations.  Gastrointestinal:  Negative for abdominal pain, blood in stool, constipation, diarrhea, heartburn, melena and vomiting.  Genitourinary:  Negative for dysuria and urgency.  Musculoskeletal:  Negative for joint pain and myalgias.  Skin:  Negative for itching and rash.  Neurological:  Negative for dizziness and headaches.  Psychiatric/Behavioral:  Negative for depression. The patient is not nervous/anxious.        Objective: BP 133/77 (BP Location: Left Arm, Patient Position: Sitting, Cuff Size: Large)   Pulse 65   Temp (!) 97.1 F (36.2 C) (Temporal)   Ht 5' 5 (1.651 m)   Wt 264 lb 8 oz (120 kg)   BMI 44.02 kg/m  Physical Exam Constitutional:      Appearance: Normal appearance. She is obese.  HENT:     Head: Normocephalic and atraumatic.  Eyes:     Extraocular Movements: Extraocular movements intact.     Conjunctiva/sclera: Conjunctivae normal.  Cardiovascular:     Rate and Rhythm: Normal rate and regular rhythm.  Pulmonary:     Effort: Pulmonary effort is normal.     Breath sounds: Normal breath sounds.  Abdominal:     General: Bowel sounds are normal.     Palpations: Abdomen is soft.  Musculoskeletal:        General: No swelling. Normal range of motion.     Cervical back: Normal range of motion and neck supple.  Skin:    General: Skin is warm and dry.     Coloration:  Skin is jaundiced.  Neurological:     General: No focal deficit present.     Mental Status: She is alert and oriented to person, place, and time.  Psychiatric:        Mood and Affect: Mood normal.        Behavior: Behavior normal.      Assessment/Plan:  1.  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  Will continue.  If needs escalation would increase her spironolactone  up to 100 mg daily as tolerated.  Monitor daily weights.   Nutrition recommendations:  High-protein diet from a primarily plant-based diet. Avoid red meat.  No raw or undercooked meat, seafood, or shellfish. Low-fat/cholesterol/carbohydrate diet. Limit sodium to no more than 2000 mg/day including everything that you eat and drink. Recommend at least 30 minutes of aerobic and resistance exercise 3 days/week.  Variceal screening-needs EGD for variceal screening given proposed systemic treatment for her HCC.  We will schedule this today. The risks including infection, bleed, or perforation as well as benefits, limitations, alternatives and imponderables have been reviewed with the patient. Potential for esophageal dilation, biopsy, etc. have also been reviewed.  Questions have been answered. All parties agreeable.  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  Chronically on lactulose  for her constipation.  Continue to monitor.  Patient is not a transplant candidate due to both the size and amount of her liver lesions.  I think the etiology of her significantly elevated bilirubin as well as her alk phos is due to her underlying HCC which is advanced and multifocal.  Cholestyramine  appears to have helped patient immensely from an itching standpoint.  Will continue.  Continue to follow-up with oncology.  Discussed case in depth with Dr.  Davonna today.   2.  Pancreatic IPMN's-continue surveillance.  3.  Epigastric pain/burning-start pantoprazole  40 mg daily.  Avoid NSAIDs.  EGD as above.  Follow-up after upper endoscopy.  12/29/2023 11:22 AM   Disclaimer: This  note was dictated with voice recognition software. Similar sounding words can inadvertently be transcribed and may not be corrected upon review.

## 2023-12-29 NOTE — H&P (View-Only) (Signed)
 Primary Care Physician:  Lari Elspeth BRAVO, MD Primary Gastroenterologist:  Dr. Cindie  Chief Complaint  Patient presents with   Hospitalization Follow-up    Patient here today for a follow up from recent hospitalization, on 12/09/2023 due to cirrhosis, and possibel HCC.    HPI:   [name] is a 68 y.o. female who presents to clinic today for hospital follow-up visit.  Reports that [name] is not seeing a primary care physician in 5 or 6 years.  Presented to [facility] with acute respiratory failure in the setting of significant fluid overload.  Blood work in ER showed significantly abnormal LFTs including T. bili 11.7, alk phos 758, ALT 63, AST 199, albumin 2.1.  RUQ US  12/09/23: Cirrhosis. Solid-appearing mass in the right lobe of the liver.    MRI abdomen/MRCP 12/10/23: Innumerable liver lesions. The largest such mass is in the right hepatic dome, measuring up to 6.7 x 8.7 cm. The second largest lesion is in the right hepatic lobe, measuring 6.3 x 7.8 cm. 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 Cirrhotic liver configuration with stigmata of portal hypertension including splenomegaly and trace ascites.  Hepatitis profile was negative for hep A, hep B, and hep C. AFP was 3.2 and CEA was 3.2   Liver biopsy 12/20/2023: Hepatocellular carcinoma, moderately differentiated.  Was diuresed aggressively with improvement in her edema.  Currently taking furosemide  40 mg daily, spironolactone  25 mg daily.  Has met with oncology.  Scheduled for CT chest to rule out metastases.  Upon further review, looks like [name] had cirrhotic findings on the CT abdomen pelvis back in February 2013.  Unfortunately [name] has not seen a physician in 5 to 6 years.  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  No history of illicit drug use or chronic alcohol use.  No family history of cancer.  Today, states [name] is  doing okay given the circumstances.  Reports lack of energy.  Was having significant itching though was recently started on cholestyramine  which has helped this immensely.    Past Medical History:  Diagnosis Date   Arthritis    Obesity    Vaginal atrophy 05/27/2015   Vaginal irritation 05/27/2015    Past Surgical History:  Procedure Laterality Date   CATARACT EXTRACTION W/PHACO Right 06/28/2017   Procedure: CATARACT EXTRACTION PHACO AND INTRAOCULAR LENS PLACEMENT (IOC);  Surgeon: Perley Hamilton, MD;  Location: [facility];  Service: Ophthalmology;  Laterality: Right;  CDE: 7.94   CHOLECYSTECTOMY  07/09/2011   Procedure: LAPAROSCOPIC CHOLECYSTECTOMY WITH INTRAOPERATIVE CHOLANGIOGRAM;  Surgeon: Camellia CHRISTELLA Blush, MD,FACS;  Location: [facility];  Service: General;  Laterality: N/A;  laparoscopic cholecystectomy with cholangiogram   JOINT REPLACEMENT     BILATERAL    TOTAL KNEE ARTHROPLASTY  2011    Current Outpatient Medications  Medication Sig Dispense Refill   carvedilol  (COREG ) 3.125 MG tablet Take 1 tablet (3.125 mg total) by mouth 2 (two) times daily. 60 tablet 11   cholestyramine  (QUESTRAN ) 4 g packet Take 1 packet (4 g total) by mouth 2 (two) times daily. 60 each 12   Cyanocobalamin (VITAMIN B-12 PO) Take by mouth. (Patient taking differently: Take by mouth daily at 6 (six) AM.)     furosemide  (LASIX ) 40 MG tablet Take 1 tablet (40 mg total) by mouth daily. 30 tablet 11   lactulose  (CHRONULAC ) 10 GM/15ML solution Take 15 mLs (10 g total) by mouth daily. (Patient taking  differently: Take 10 g by mouth daily. 1 full tablespoon, and some time increases it to two Tablespoons depending on stool output.) 236 mL 4   pantoprazole  (PROTONIX ) 40 MG tablet Take 1 tablet (40 mg total) by mouth daily. 30 tablet 11   sodium chloride  (OCEAN) 0.65 % SOLN nasal spray Place 1 spray into both nostrils as needed for congestion.     spironolactone  (ALDACTONE ) 25 MG tablet Take 1 tablet (25 mg total) by mouth daily. 30  tablet 11   VITAMIN D PO Take by mouth. (Patient taking differently: Take by mouth daily at 6 (six) AM.)     No current facility-administered medications for this visit.    Allergies as of 12/29/2023 - Review Complete 12/29/2023  Allergen Reaction Noted   Penicillins Rash 06/13/2011    Family History  Problem Relation Age of Onset   Cancer Mother        lung   Diabetes Father    Heart attack Paternal Grandmother    Heart attack Paternal Grandfather    Diabetes Brother     Social History   Socioeconomic History   Marital status: Married    Spouse name: Not on file   Number of children: Not on file   Years of education: Not on file   Highest education level: Not on file  Occupational History   Not on file  Tobacco Use   Smoking status: Never    Passive exposure: Never   Smokeless tobacco: Never  Vaping Use   Vaping status: Never Used  Substance and Sexual Activity   Alcohol use: No   Drug use: No   Sexual activity: Yes    Birth control/protection: Post-menopausal  Other Topics Concern   Not on file  Social History Narrative   Not on file   Social Drivers of Health   Financial Resource Strain: Not on file  Food Insecurity: No Food Insecurity (12/09/2023)   Hunger Vital Sign    Worried About Running Out of Food in the Last Year: Never true    Ran Out of Food in the Last Year: Never true  Transportation Needs: No Transportation Needs (12/09/2023)   PRAPARE - Administrator, Civil Service (Medical): No    Lack of Transportation (Non-Medical): No  Physical Activity: Not on file  Stress: Not on file  Social Connections: Socially Integrated (12/09/2023)   Social Connection and Isolation Panel    Frequency of Communication with Friends and Family: Twice a week    Frequency of Social Gatherings with Friends and Family: Twice a week    Attends Religious Services: More than 4 times per year    Active Member of [employer] or Organizations: Yes    Attends Museum/gallery exhibitions officer: More than 4 times per year    Marital Status: Married  Catering manager Violence: Not At Risk (12/09/2023)   Humiliation, Afraid, Rape, and Kick questionnaire    Fear of Current or Ex-Partner: No    Emotionally Abused: No    Physically Abused: No    Sexually Abused: No    Subjective: Review of Systems  Constitutional:  Negative for chills and fever.  HENT:  Negative for congestion and hearing loss.   Eyes:  Negative for blurred vision and double vision.  Respiratory:  Negative for cough and shortness of breath.   Cardiovascular:  Negative for chest pain and palpitations.  Gastrointestinal:  Negative for abdominal pain, blood in stool, constipation, diarrhea, heartburn, melena and vomiting.  Genitourinary:  Negative for dysuria and urgency.  Musculoskeletal:  Negative for joint pain and myalgias.  Skin:  Negative for itching and rash.  Neurological:  Negative for dizziness and headaches.  Psychiatric/Behavioral:  Negative for depression. The patient is not nervous/anxious.        Objective: BP 133/77 (BP Location: Left Arm, Patient Position: Sitting, Cuff Size: Large)   Pulse 65   Temp (!) 97.1 F (36.2 C) (Temporal)   Ht 5' 5 (1.651 m)   Wt 264 lb 8 oz (120 kg)   BMI 44.02 kg/m  Physical Exam Constitutional:      Appearance: Normal appearance. [name] is obese.  HENT:     Head: Normocephalic and atraumatic.  Eyes:     Extraocular Movements: Extraocular movements intact.     Conjunctiva/sclera: Conjunctivae normal.  Cardiovascular:     Rate and Rhythm: Normal rate and regular rhythm.  Pulmonary:     Effort: Pulmonary effort is normal.     Breath sounds: Normal breath sounds.  Abdominal:     General: Bowel sounds are normal.     Palpations: Abdomen is soft.  Musculoskeletal:        General: No swelling. Normal range of motion.     Cervical back: Normal range of motion and neck supple.  Skin:    General: Skin is warm and dry.     Coloration:  Skin is jaundiced.  Neurological:     General: No focal deficit present.     Mental Status: [name] is alert and oriented to person, place, and time.  Psychiatric:        Mood and Affect: Mood normal.        Behavior: Behavior normal.      Assessment/Plan:  1.  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  Will continue.  If needs escalation would increase her spironolactone  up to 100 mg daily as tolerated.  Monitor daily weights.   Nutrition recommendations:  High-protein diet from a primarily plant-based diet. Avoid red meat.  No raw or undercooked meat, seafood, or shellfish. Low-fat/cholesterol/carbohydrate diet. Limit sodium to no more than 2000 mg/day including everything that you eat and drink. Recommend at least 30 minutes of aerobic and resistance exercise 3 days/week.  Variceal screening-needs EGD for variceal screening given proposed systemic treatment for her HCC.  We will schedule this today. The risks including infection, bleed, or perforation as well as benefits, limitations, alternatives and imponderables have been reviewed with the patient. Potential for esophageal dilation, biopsy, etc. have also been reviewed.  Questions have been answered. All parties agreeable.  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  Chronically on lactulose  for her constipation.  Continue to monitor.  Patient is not a transplant candidate due to both the size and amount of her liver lesions.  I think the etiology of her significantly elevated bilirubin as well as her alk phos is due to her underlying HCC which is advanced and multifocal.  Cholestyramine  appears to have helped patient immensely from an itching standpoint.  Will continue.  Continue to follow-up with oncology.  Discussed case in depth with Dr.  Davonna today.   2.  Pancreatic IPMN's-continue surveillance.  3.  Epigastric pain/burning-start pantoprazole  40 mg daily.  Avoid NSAIDs.  EGD as above.  Follow-up after upper endoscopy.  12/29/2023 11:22 AM   Disclaimer: This  note was dictated with voice recognition software. Similar sounding words can inadvertently be transcribed and may not be corrected upon review.

## 2023-12-29 NOTE — Patient Instructions (Addendum)
 We will schedule you for upper endoscopy for variceal screening.   Continue on Lasix  and spironolactone  for your fluid overload.   Continue follow up with oncology.  I am going to start you on pantoprazole  40 mg daily for your abdominal pain.   Follow up after EGD.   It was very nice meeting you all today.   Dr. Cindie

## 2023-12-29 NOTE — Telephone Encounter (Signed)
 Spoke with pt. She has been scheduled for 9/3. Aware will call back regarding pre-op appt. Instructions will be sent to mychart but also discussed over the phone   Per [facility] Notification or Prior Authorization is not required for the requested services This [employer] member's plan does not currently require a prior authorization for these services. If you have general questions about the prior authorization requirements, please call us  at [phone] or visit [email or web address] > Clinician Resources > Advance and Admission Notification Requirements. The number above acknowledges your notification. Please write this number down for future reference. Notification is not a guarantee of coverage or payment. Decision ID #: [id number]

## 2023-12-29 NOTE — Telephone Encounter (Signed)
 Spoke with pt, informed pt of pre-op appt on 01/04/2024. Pt voiced understanding.

## 2024-01-04 ENCOUNTER — Encounter (HOSPITAL_COMMUNITY): Payer: Self-pay

## 2024-01-04 ENCOUNTER — Encounter (HOSPITAL_COMMUNITY)
Admission: RE | Admit: 2024-01-04 | Discharge: 2024-01-04 | Disposition: A | Discharge status: Home / Self Care | Source: Ambulatory Visit | Attending: Internal Medicine | Admitting: Internal Medicine

## 2024-01-04 DIAGNOSIS — K7460 Unspecified cirrhosis of liver: Secondary | ICD-10-CM

## 2024-01-04 HISTORY — DX: Essential (primary) hypertension: I10

## 2024-01-04 HISTORY — DX: Gastro-esophageal reflux disease without esophagitis: K21.9

## 2024-01-05 ENCOUNTER — Ambulatory Visit (HOSPITAL_COMMUNITY)
Admission: RE | Admit: 2024-01-05 | Discharge: 2024-01-05 | Disposition: A | Discharge status: Home / Self Care | Attending: Internal Medicine | Admitting: Internal Medicine

## 2024-01-05 ENCOUNTER — Encounter (HOSPITAL_COMMUNITY): Payer: Self-pay | Admitting: Internal Medicine

## 2024-01-05 ENCOUNTER — Encounter (HOSPITAL_COMMUNITY)
Admission: RE | Disposition: A | Discharge status: Home / Self Care | Payer: Self-pay | Source: Home / Self Care | Attending: Internal Medicine

## 2024-01-05 ENCOUNTER — Ambulatory Visit (HOSPITAL_BASED_OUTPATIENT_CLINIC_OR_DEPARTMENT_OTHER): Admitting: Certified Registered"

## 2024-01-05 ENCOUNTER — Ambulatory Visit (HOSPITAL_COMMUNITY): Admitting: Certified Registered"

## 2024-01-05 DIAGNOSIS — K7460 Unspecified cirrhosis of liver: Secondary | ICD-10-CM | POA: Insufficient documentation

## 2024-01-05 DIAGNOSIS — I509 Heart failure, unspecified: Secondary | ICD-10-CM

## 2024-01-05 DIAGNOSIS — C220 Liver cell carcinoma: Secondary | ICD-10-CM | POA: Insufficient documentation

## 2024-01-05 DIAGNOSIS — E669 Obesity, unspecified: Secondary | ICD-10-CM | POA: Diagnosis not present

## 2024-01-05 DIAGNOSIS — K3189 Other diseases of stomach and duodenum: Secondary | ICD-10-CM | POA: Insufficient documentation

## 2024-01-05 DIAGNOSIS — B159 Hepatitis A without hepatic coma: Secondary | ICD-10-CM | POA: Diagnosis not present

## 2024-01-05 DIAGNOSIS — Z13810 Encounter for screening for upper gastrointestinal disorder: Secondary | ICD-10-CM

## 2024-01-05 DIAGNOSIS — Z6841 Body Mass Index (BMI) 40.0 and over, adult: Secondary | ICD-10-CM | POA: Diagnosis not present

## 2024-01-05 DIAGNOSIS — K2970 Gastritis, unspecified, without bleeding: Secondary | ICD-10-CM | POA: Insufficient documentation

## 2024-01-05 DIAGNOSIS — Z79899 Other long term (current) drug therapy: Secondary | ICD-10-CM | POA: Diagnosis not present

## 2024-01-05 DIAGNOSIS — I110 Hypertensive heart disease with heart failure: Secondary | ICD-10-CM | POA: Insufficient documentation

## 2024-01-05 DIAGNOSIS — K766 Portal hypertension: Secondary | ICD-10-CM | POA: Diagnosis not present

## 2024-01-05 DIAGNOSIS — I4510 Unspecified right bundle-branch block: Secondary | ICD-10-CM | POA: Diagnosis not present

## 2024-01-05 HISTORY — PX: ESOPHAGOGASTRODUODENOSCOPY: SHX5428

## 2024-01-05 SURGERY — EGD (ESOPHAGOGASTRODUODENOSCOPY)
Anesthesia: General

## 2024-01-05 MED ORDER — LIDOCAINE HCL (CARDIAC) PF 100 MG/5ML IV SOSY
PREFILLED_SYRINGE | INTRAVENOUS | Status: DC | PRN
  Administered 2024-01-05: 100 mg via INTRAVENOUS

## 2024-01-05 MED ORDER — LACTATED RINGERS IV SOLN: INTRAVENOUS | Status: DC | PRN

## 2024-01-05 MED ORDER — PROPOFOL 10 MG/ML IV BOLUS
INTRAVENOUS | Status: DC | PRN
  Administered 2024-01-05: 40 mg via INTRAVENOUS
  Administered 2024-01-05: 100 mg via INTRAVENOUS

## 2024-01-05 NOTE — Transfer of Care (Signed)
 Immediate Anesthesia Transfer of Care Note  Patient: [name]  Procedure(s) Performed: EGD (ESOPHAGOGASTRODUODENOSCOPY)  Patient Location: Short Stay  Anesthesia Type:General  Level of Consciousness: awake, alert , oriented, and patient cooperative  Airway & Oxygen Therapy: Patient Spontanous Breathing  Post-op Assessment: Report given to RN and Post -op Vital signs reviewed and stable  Post vital signs: Reviewed and stable  Last Vitals:  Vitals Value Taken Time  BP    Temp    Pulse    Resp    SpO2      Last Pain:  Vitals:   01/05/24 1259  TempSrc:   PainSc: 0-No pain      Patients Stated Pain Goal: 2 (01/05/24 1202)  Complications: No notable events documented.

## 2024-01-05 NOTE — Discharge Instructions (Addendum)
 EGD Discharge instructions Please read the instructions outlined below and refer to this sheet in the next few weeks. These discharge instructions provide you with general information on caring for yourself after you leave the hospital. Your doctor may also give you specific instructions. While your treatment has been planned according to the most current medical practices available, unavoidable complications occasionally occur. If you have any problems or questions after discharge, please call your doctor. ACTIVITY You may resume your regular activity but move at a slower pace for the next 24 hours.  Take frequent rest periods for the next 24 hours.  Walking will help expel (get rid of) the air and reduce the bloated feeling in your abdomen.  No driving for 24 hours (because of the anesthesia (medicine) used during the test).  You may shower.  Do not sign any important legal documents or operate any machinery for 24 hours (because of the anesthesia used during the test).  NUTRITION Drink plenty of fluids.  You may resume your normal diet.  Begin with a light meal and progress to your normal diet.  Avoid alcoholic beverages for 24 hours or as instructed by your caregiver.  MEDICATIONS You may resume your normal medications unless your caregiver tells you otherwise.  WHAT YOU CAN EXPECT TODAY You may experience abdominal discomfort such as a feeling of fullness or "gas" pains.  FOLLOW-UP Your doctor will discuss the results of your test with you.  SEEK IMMEDIATE MEDICAL ATTENTION IF ANY OF THE FOLLOWING OCCUR: Excessive nausea (feeling sick to your stomach) and/or vomiting.  Severe abdominal pain and distention (swelling).  Trouble swallowing.  Temperature over 101 F (37.8 C).  Rectal bleeding or vomiting of blood.   Your upper endoscopy did not reveal any evidence of esophageal varices.  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  Small  bowel appeared normal.  I forwarded my findings to Dr. Davonna.  Follow up in GI office in 8 weeks   I hope you have a great rest of your week!  Carlin POUR. Cindie, D.O. Gastroenterology and Hepatology [facility]

## 2024-01-05 NOTE — Anesthesia Postprocedure Evaluation (Signed)
 Anesthesia Post Note  Patient: [name]  Procedure(s) Performed: EGD (ESOPHAGOGASTRODUODENOSCOPY)  Patient location during evaluation: Phase II Anesthesia Type: General Level of consciousness: awake and alert Pain management: pain level controlled Vital Signs Assessment: post-procedure vital signs reviewed and stable Respiratory status: spontaneous breathing, nonlabored ventilation and respiratory function stable Cardiovascular status: stable Anesthetic complications: no   There were no known notable events for this encounter.   Last Vitals:  Vitals:   01/05/24 1202 01/05/24 1309  BP: 124/69 113/68  Pulse: 68 65  Resp: 16 13  Temp: 36.5 C 36.5 C  SpO2: 99% 100%    Last Pain:  Vitals:   01/05/24 1309  TempSrc: Oral  PainSc: 0-No pain                 Tawania Daponte L Arney Mayabb

## 2024-01-05 NOTE — Op Note (Signed)
 [facility] Patient Name: [name] Procedure Date: 01/05/2024 12:52 PM MRN: [id number] Date of Birth: 1955-10-16 Attending MD: Carlin POUR. Cindie , [place], [id number] CSN: [id number] Age: 68 Admit Type: Outpatient Procedure:                Upper GI endoscopy Indications:              Cirrhosis rule out esophageal varices Providers:                Carlin POUR. Cindie, DO, Devere Lodge, Crystal Page,                            Jon Loge Referring MD:              Medicines:                See the Anesthesia note for documentation of the                            administered medications Complications:            No immediate complications. Estimated Blood Loss:     Estimated blood loss: none. Procedure:                Pre-Anesthesia Assessment:                           - The anesthesia plan was to use monitored                            anesthesia care (MAC).                           After obtaining informed consent, the endoscope was                            passed under direct vision. Throughout the                            procedure, the patient's blood pressure, pulse, and                            oxygen saturations were monitored continuously. The                            [id number] ([id number]) Upper was introduced through                            the mouth, and advanced to the second part of                            duodenum. The upper GI endoscopy was accomplished                            without difficulty. The patient tolerated the                            procedure well. Scope In:  1:04:17 PM Scope Out: 1:06:45 PM Total Procedure Duration: 0 hours 2 minutes 28 seconds  Findings:      There is no endoscopic evidence of varices in the entire esophagus.      Moderate portal hypertensive gastropathy was found in the entire       examined stomach.      Diffuse moderate inflammation was found in the entire examined stomach.      The duodenal bulb, first  portion of the duodenum and second portion of       the duodenum were normal. Impression:               - Portal hypertensive gastropathy.                           - Gastritis.                           - Normal duodenal bulb, first portion of the                            duodenum and second portion of the duodenum.                           - No specimens collected. Moderate Sedation:      Per Anesthesia Care Recommendation:           - Patient has a contact number available for                            emergencies. The signs and symptoms of potential                            delayed complications were discussed with the                            patient. Return to normal activities tomorrow.                            Written discharge instructions were provided to the                            patient.                           - Resume previous diet.                           - Continue present medications.                           - Return to GI clinic in 8 weeks.                           - I forwarded findings to patient's oncologist Procedure Code(s):        --- Professional ---                           [phone],  Esophagogastroduodenoscopy, flexible,                            transoral; diagnostic, including collection of                            specimen(s) by brushing or washing, when performed                            (separate procedure) Diagnosis Code(s):        --- Professional ---                           K76.6, Portal hypertension                           K31.89, Other diseases of stomach and duodenum                           K29.70, Gastritis, unspecified, without bleeding                           K74.60, Unspecified cirrhosis of liver CPT copyright 2022 American Medical Association. All rights reserved. The codes documented in this report are preliminary and upon coder review may  be revised to meet current compliance requirements. Carlin POUR. Cindie,  DO Carlin POUR. Brittyn Salaz, DO 01/05/2024 1:10:20 PM This report has been signed electronically. Number of Addenda: 0

## 2024-01-05 NOTE — Anesthesia Preprocedure Evaluation (Addendum)
 Anesthesia Evaluation  Patient identified by MRN, date of birth, ID band Patient awake    Airway Mallampati: I  TM Distance: >3 FB Neck ROM: Full    Dental no notable dental hx. (+) Dental Advisory Given, Teeth Intact   Pulmonary  History hypoxic respiratory failure   Pulmonary exam normal        Cardiovascular hypertension, +CHF  Normal cardiovascular exam Rhythm:Regular Rate:Normal   Normal sinus rhythm Right bundle branch block Septal infarct , age undetermined  Echo is normal. CHF must be associated with liver issues   Neuro/Psych    GI/Hepatic ,GERD  ,,(+) Cirrhosis       , Hepatitis -LIVER CANCER. Jaundiced   Endo/Other    Class 3 obesity  Renal/GU negative Renal ROS     Musculoskeletal negative musculoskeletal ROS (+) Arthritis , Osteoarthritis,    Abdominal Normal abdominal exam  (+)   Peds  Hematology negative hematology ROS (+)   Anesthesia Other Findings   Reproductive/Obstetrics negative OB ROS                              Anesthesia Physical Anesthesia Plan  ASA: 4  Anesthesia Plan: General   Post-op Pain Management: Minimal or no pain anticipated   Induction:   PONV Risk Score and Plan: Propofol  infusion  Airway Management Planned: Nasal Cannula and Natural Airway  Additional Equipment: None  Intra-op Plan:   Post-operative Plan:   Informed Consent: I have reviewed the patients History and Physical, chart, labs and discussed the procedure including the risks, benefits and alternatives for the proposed anesthesia with the patient or authorized representative who has indicated his/her understanding and acceptance.     Dental advisory given  Plan Discussed with: CRNA  Anesthesia Plan Comments:          Anesthesia Quick Evaluation

## 2024-01-05 NOTE — Interval H&P Note (Signed)
 History and Physical Interval Note:  01/05/2024 12:54 PM  [name]  has presented today for surgery, with the diagnosis of Variceal Screening.  The various methods of treatment have been discussed with the patient and family. After consideration of risks, benefits and other options for treatment, the patient has consented to  Procedure(s) with comments: EGD (ESOPHAGOGASTRODUODENOSCOPY) (N/A) - 1:30pm, ASA 4 as a surgical intervention.  The patient's history has been reviewed, patient examined, no change in status, stable for surgery.  I have reviewed the patient's chart and labs.  Questions were answered to the patient's satisfaction.     [name]

## 2024-01-10 ENCOUNTER — Encounter (HOSPITAL_COMMUNITY): Payer: Self-pay | Admitting: Internal Medicine

## 2024-01-10 ENCOUNTER — Ambulatory Visit (HOSPITAL_COMMUNITY)
Admission: RE | Admit: 2024-01-10 | Discharge: 2024-01-10 | Disposition: A | Discharge status: Home / Self Care | Source: Ambulatory Visit | Attending: Oncology | Admitting: Oncology

## 2024-01-10 DIAGNOSIS — C220 Liver cell carcinoma: Secondary | ICD-10-CM | POA: Insufficient documentation

## 2024-01-10 MED ORDER — IOHEXOL 300 MG/ML  SOLN
80.0000 mL | Freq: Once | INTRAMUSCULAR | Status: AC | PRN
  Administered 2024-01-10: 80 mL via INTRAVENOUS

## 2024-01-12 ENCOUNTER — Ambulatory Visit
Admission: RE | Admit: 2024-01-12 | Discharge: 2024-01-12 | Disposition: A | Discharge status: Home / Self Care | Source: Ambulatory Visit | Attending: Oncology | Admitting: Oncology

## 2024-01-12 DIAGNOSIS — C220 Liver cell carcinoma: Secondary | ICD-10-CM

## 2024-01-12 HISTORY — PX: IR RADIOLOGIST EVAL & MGMT: IMG5224

## 2024-01-12 NOTE — Consult Note (Signed)
 Chief Complaint: Patient was seen in consultation today for hepatocellular carcinoma consultation  Referring Physician(s): Kandala,Hyndavi  History of Present Illness: [name] is a 68 y.o. female a 68 y.o. female with PMH sig for arthritis, obesity, fatty liver, prior cholecystectomy 2013 who was recently admitted to [facility] for jaundice, elevated LFT's, LE edema, dyspnea. She had imaging that demonstrated innumerable liver lesions and underwent ultrasound guided liver biopsy on 12/20/23.  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  Patient was referred to Interventional Radiology to discuss possible liver-directed therapies.  Patient is currently in wheelchair but able to ambulate at home with assistance.  She is now using supplemental oxygen.  She is noticeably jaundice.  She complains of shortness of breath when lying down and leg swelling. She denies bloody or dark stools.  Recently had EGD with no evidence of varices but evidence for portal hypertensive gastropathy.  Past Medical History:  Diagnosis Date   Arthritis    GERD (gastroesophageal reflux disease)    Hypertension    Obesity    Vaginal atrophy 05/27/2015   Vaginal irritation 05/27/2015    Past Surgical History:  Procedure Laterality Date   CATARACT EXTRACTION W/PHACO Right 06/28/2017   Procedure: CATARACT EXTRACTION PHACO AND INTRAOCULAR LENS PLACEMENT (IOC);  Surgeon: Perley Hamilton, MD;  Location: [facility] ORS;  Service: Ophthalmology;  Laterality: Right;  CDE: 7.94   CHOLECYSTECTOMY  07/09/2011   Procedure: LAPAROSCOPIC CHOLECYSTECTOMY WITH INTRAOPERATIVE CHOLANGIOGRAM;  Surgeon: Camellia CHRISTELLA Blush, MD,FACS;  Location: [facility] OR;  Service: General;  Laterality: N/A;  laparoscopic cholecystectomy with cholangiogram   ESOPHAGOGASTRODUODENOSCOPY N/A 01/05/2024   Procedure: EGD (ESOPHAGOGASTRODUODENOSCOPY);  Surgeon: Cindie Carlin POUR, DO;  Location: [facility] ENDO SUITE;  Service: Endoscopy;  Laterality: N/A;   1:30pm, ASA 4   JOINT REPLACEMENT     BILATERAL    TOTAL KNEE ARTHROPLASTY  2011    Allergies: Penicillins  Medications: Prior to Admission medications   Medication Sig Start Date End Date Taking? Authorizing Provider  carvedilol  (COREG ) 3.125 MG tablet Take 1 tablet (3.125 mg total) by mouth 2 (two) times daily. 12/12/23 12/11/24  Pearlean Manus, MD  cholestyramine  (QUESTRAN ) 4 g packet Take 1 packet (4 g total) by mouth 2 (two) times daily. 12/27/23 01/26/24  Kandala, Hyndavi, MD  Cyanocobalamin (VITAMIN B-12 PO) Take by mouth. Patient taking differently: Take by mouth daily at 6 (six) AM.    [provider]  furosemide  (LASIX ) 40 MG tablet Take 1 tablet (40 mg total) by mouth daily. 12/12/23 12/11/24  Pearlean Manus, MD  lactulose  (CHRONULAC ) 10 GM/15ML solution Take 15 mLs (10 g total) by mouth daily. Patient taking differently: Take 10 g by mouth daily. 1 full tablespoon, and some time increases it to two Tablespoons depending on stool output. 12/12/23   Pearlean Manus, MD  pantoprazole  (PROTONIX ) 40 MG tablet Take 1 tablet (40 mg total) by mouth daily. 12/29/23 12/28/24  Carver, Charles K, DO  sodium chloride  (OCEAN) 0.65 % SOLN nasal spray Place 1 spray into both nostrils as needed for congestion.    [provider]  spironolactone  (ALDACTONE ) 25 MG tablet Take 1 tablet (25 mg total) by mouth daily. 12/12/23 12/11/24  Pearlean Manus, MD  VITAMIN D PO Take by mouth. Patient taking differently: Take by mouth daily at 6 (six) AM.    [provider]     Family History  Problem Relation Age of Onset   Cancer Mother        lung   Diabetes Father  Heart attack Paternal Grandmother    Heart attack Paternal Grandfather    Diabetes Brother     Social History   Socioeconomic History   Marital status: Married    Spouse name: Not on file   Number of children: Not on file   Years of education: Not on file   Highest education level: Not on file   Occupational History   Not on file  Tobacco Use   Smoking status: Never    Passive exposure: Never   Smokeless tobacco: Never  Vaping Use   Vaping status: Never Used  Substance and Sexual Activity   Alcohol use: No   Drug use: No   Sexual activity: Yes    Birth control/protection: Post-menopausal  Other Topics Concern   Not on file  Social History Narrative   Not on file   Social Drivers of Health   Financial Resource Strain: Not on file  Food Insecurity: No Food Insecurity (12/09/2023)   Hunger Vital Sign    Worried About Running Out of Food in the Last Year: Never true    Ran Out of Food in the Last Year: Never true  Transportation Needs: No Transportation Needs (12/09/2023)   PRAPARE - Administrator, Civil Service (Medical): No    Lack of Transportation (Non-Medical): No  Physical Activity: Not on file  Stress: Not on file  Social Connections: Socially Integrated (12/09/2023)   Social Connection and Isolation Panel    Frequency of Communication with Friends and Family: Twice a week    Frequency of Social Gatherings with Friends and Family: Twice a week    Attends Religious Services: More than 4 times per year    Active Member of [employer] or Organizations: Yes    Attends Engineer, structural: More than 4 times per year    Marital Status: Married     Review of Systems  Constitutional:  Positive for activity change.  Respiratory:  Positive for shortness of breath.   Cardiovascular:  Positive for leg swelling. Negative for chest pain.  Gastrointestinal:  Negative for blood in stool.    Vital Signs: BP 120/68 (BP Location: Left Arm, Patient Position: Sitting, Cuff Size: Normal) Comment (BP Location): LFA  Pulse 65   Temp (!) 97.4 F (36.3 C) (Oral)   Resp (!) 24   SpO2 95% Comment: on 2L [place]    Physical Exam Constitutional:      Appearance: She is obese. She is ill-appearing.  Eyes:     General: Scleral icterus present.  Cardiovascular:      Rate and Rhythm: Normal rate.  Pulmonary:     Effort: Pulmonary effort is normal.  Abdominal:     Palpations: Abdomen is soft.     Tenderness: There is no abdominal tenderness.  Musculoskeletal:     Right lower leg: Edema present.     Left lower leg: Edema present.  Skin:    Coloration: Skin is jaundiced.  Neurological:     Mental Status: She is alert.        Imaging: CT Chest W Contrast Addendum Date: 01/10/2024 ADDENDUM REPORT: 01/10/2024 14:13 ADDENDUM: Not included above are multiple nonacute anterior right rib fractures, including third and fourth. The fourth fracture may be subacute, including on 61/2. Electronically Signed   By: Rockey Kilts M.D.   On: 01/10/2024 14:13   Result Date: 01/10/2024 CLINICAL DATA:  Staging of hepatocellular carcinoma. * Tracking Code: BO * EXAM: CT CHEST WITH CONTRAST TECHNIQUE: Multidetector CT  imaging of the chest was performed during intravenous contrast administration. RADIATION DOSE REDUCTION: This exam was performed according to the departmental dose-optimization program which includes automated exposure control, adjustment of the mA and/or kV according to patient size and/or use of iterative reconstruction technique. CONTRAST:  80mL OMNIPAQUE  IOHEXOL  300 MG/ML  SOLN COMPARISON:  MRI of the abdomen of 12/10/2023. Chest radiograph of 12/09/2023 no prior chest CT. FINDINGS: Cardiovascular: Aortic atherosclerosis. Tortuous thoracic aorta. Mild cardiomegaly, without pericardial effusion. Lad and right coronary artery calcification. No central pulmonary embolism, on this non-dedicated study. Mediastinum/Nodes: No mediastinal or hilar adenopathy. Subtle periesophageal varices Lungs/Pleura: Small left and trace right pleural effusions. Dependent bibasilar airspace disease is new since the prior MRI. Left upper lobe 4 mm calcified granuloma on 59/3. Bilateral relatively well-circumscribed/rounded pulmonary nodules are identified on series 3. Example 5 mm in the  superior segment left lower lobe on 47/3. Within the right lower lobe at 4 mm on image 61/3. Within the anterior right middle lobe at 3 mm on 76/3. 5 x 5 mm in the inferior right middle lobe on 96/3. Upper Abdomen: Hepatic replacement by innumerable masses, as before. Portosystemic collaterals. Normal imaged portions of the spleen, stomach, pancreas, adrenal glands, kidneys. Musculoskeletal: Anasarca.  Mild osteopenia. IMPRESSION: 1. Multiple bilateral pulmonary nodules of maximally 5 mm. Technically indeterminate but suspicious for early pulmonary metastasis. Consider chest CT follow-up at 6 months. 2. New tiny bilateral pleural effusions with dependent bibasilar airspace disease, new since the prior MRI. Dependent airspace disease could represent compressive atelectasis or early infection. 3. No thoracic adenopathy 4. Incidental findings, including: Coronary artery atherosclerosis. Aortic Atherosclerosis (ICD10-I70.0). Electronically Signed: By: Rockey Kilts M.D. On: 01/10/2024 13:51   US  BIOPSY (LIVER) Result Date: 12/20/2023 CLINICAL DATA:  Gallbladder/biliary carcinoma, liver lesions EXAM: ULTRASOUND-GUIDED CORE LIVER BIOPSY TECHNIQUE: An ultrasound guided liver biopsy was thoroughly discussed with the patient and questions were answered. The benefits, risks, alternatives, and complications were also discussed. The patient understands and wishes to proceed with the procedure. A verbal as well as written consent was obtained. Survey ultrasound of the liver was performed, a representative left lobe lesion localized, and an appropriate skin entry site was determined. Skin site was marked, prepped with chlorhexidine , and draped in usual sterile fashion, and infiltrated locally with 1% lidocaine . Intravenous Fentanyl  50mcg and Versed  2mg  were administered by RN during a total moderate (conscious) sedation time of 12 minutes; the patient's level of consciousness and physiological / cardiorespiratory status were  monitored continuously by radiology RN under my direct supervision. A 17 gauge trocar needle was advanced under ultrasound guidance into the liver to the margin of the lesion. 3 solid-appearing coaxial 18gauge core samples were then obtained through the guide needle. The guide needle was removed. Post procedure scans demonstrate no apparent complication. COMPLICATIONS: COMPLICATIONS None immediate FINDINGS: Heterogenous hepatic parenchyma. Dominant partially exophytic hypoechoic left lobe lesion was localized corresponding to MR findings. Representative core biopsy samples obtained as above. IMPRESSION: 1. Technically successful ultrasound guided core liver lesion biopsy. Electronically Signed   By: JONETTA Faes M.D.   On: 12/20/2023 16:21    Labs:  CBC: Recent Labs    12/09/23 1214 12/20/23 1205  WBC 6.9 10.4  HGB 12.6 11.6*  HCT 38.7 34.8*  PLT 429* 383    COAGS: Recent Labs    12/10/23 1149 12/20/23 1205  INR 1.2 1.1    BMP: Recent Labs    12/09/23 1214 12/10/23 0526 12/11/23 0431 12/12/23 0441  NA 137 138  139 133*  K 3.7 3.4* 4.4 3.9  CL 96* 94* 97* 92*  CO2 27 28 30 29   GLUCOSE 95 74 74 87  BUN 18 18 21  25*  CALCIUM 8.6* 8.4* 8.4* 8.2*  CREATININE 1.03* 0.95 1.21* 1.15*  GFRNONAA 60* >60 49* 52*    LIVER FUNCTION TESTS: Recent Labs    12/09/23 1214 12/10/23 0526 12/11/23 0431 12/12/23 0441  BILITOT 11.7* 10.7* 10.1* 10.9*  AST 199* 181* 199* 218*  ALT 63* 54* 57* 59*  ALKPHOS 758* 648* 658* 622*  PROT 6.4* 5.5* 5.4* 5.3*  ALBUMIN 2.1* 1.8* 1.9* 1.8*    TUMOR MARKERS: No results for input(s): AFPTM, CEA, CA199, CHROMGRNA in the last 8760 hours.  Assessment and Plan:  68 yo with extensive multifocal HCC and significant liver dysfunction with jaundice and markedly elevated bilirubin.  Reviewed recent imaging and no evidence for reversible biliary dilatation or obstruction.  Abnormal liver function appears to be combination of cirrhosis and  extensive tumor burden.  Based on patient's liver function, she is not a candidate for catheter-directed liver therapy with either Y90 or chemoembolization.  I had a long discussion with patient and family about liver-directed therapies and explained that she would not tolerate these treatments with her current liver function. Hopefully patient will be a candidate for immunotherapy.  If her liver functions improves in the future, we could re-evaluate her for Y90 radioembolization.   Thank you for this interesting consult.  I greatly enjoyed meeting [name] and look forward to participating in their care.  A copy of this report was sent to the requesting provider on this date.  Electronically Signed: Juliene JONELLE Balder 01/12/2024, 12:27 PM   I spent a total of  30 Minutes   in face to face in clinical consultation, greater than 50% of which was counseling/coordinating care for hepatocellular carcinoma.

## 2024-01-12 NOTE — Progress Notes (Signed)
 Patient Care Team: Lari Elspeth BRAVO, MD as PCP - General (Family Medicine) Davonna Siad, MD as Medical Oncologist (Medical Oncology) Celestia Joesph SQUIBB, RN as Oncology Nurse Navigator (Medical Oncology)  Clinic Day:  01/12/2024  Referring physician: Lari Elspeth BRAVO, MD   CHIEF COMPLAINT:  CC: Moderately differentiated hepatocellular carcinoma    ASSESSMENT & PLAN:   Assessment & Plan: [name]  is a 68 y.o. 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  Has some small lung nodules can be monitored - Discussed that at this time, patient needs systemic therapy for her [facility] as she is not a candidate for local treatment options -The HIMALAYA trial demonstrated that the combination of atezolizumab and bevacizumab significantly improved overall survival (OS) and progression-free survival (PFS) in patients with unresectable hepatocellular carcinoma (HCC). -Atezolizumab plus bevacizumab (Atez-Bev) has demonstrated efficacy in unresectable hepatocellular carcinoma (HCC), with real-world data showing median overall survival (OS) of 20.9 months and progression-free survival (PFS) of 6.8 months.The most common associated adverse events include elevated liver enzymes, hypertension, proteinuria, and gastrointestinal bleeding.  - Will try to have port placement next week, tentatively starting first cycle with just atezolizumab and adding bevacizumab with second cycle (considering risk of bleeding and  delayed wound healing after port placement). If not, will start treatment without port. -Will schedule chemotherapy education session -Will repeat a CT CAP after 3 cycles to assess for response. - Will repeat baseline labs today  RTC prior to second cycle for assessment of tolerance Jaundice Patient has elevated bilirubin, elevated ALP. MRCP consistent with innumerable liver lesions and probable IPMN Itching improved with cholestyramine . Jaundice improved  - Currently taking lactulose  and rifaximin -Continue cholestyramine  for itching Anxiety Will prescribe hydroxyzine  50 mg nightly as needed (limited options considering poor LFTs) Folate deficiency Serum folate <5  - Prescribed folic acid  1mg  daily Hypoalbuminemia Likely secondary to malnutrition and liver cirrhosis  - Nutrition referral for support    The patient understands the plans discussed today and is in agreement with them.  She knows to contact our office if she develops concerns prior to her next appointment.  I provided 60 minutes of face-to-face time during this encounter and > 50% was spent counseling as documented under my assessment and plan.    [name],acting as a Neurosurgeon for Siad Davonna, MD.,have documented all relevant documentation on the behalf of Siad Davonna, MD,as directed by  Siad Davonna, MD while in the presence of Siad Davonna, MD.  I, Siad Davonna MD, have reviewed the above documentation for accuracy and completeness, and I agree with the above.     Tracy SAUNDERS Ege  [facility] [place] - A DEPT OF [facility] [address] [place] [address] Dept: [phone] Dept Fax: [phone]   No orders of the defined types were placed in this encounter.    ONCOLOGY HISTORY:     Diagnosis: Moderately differentiated hepatocellular carcinoma   -Presentation: Significant bilateral lower extremity  edema -12/09/2023: US   of abdomen: Cirrhosis. Solid-appearing mass in the right lobe of the liver. -12/10/2023: MRI/MRCP: Innumerable liver lesions. The largest such mass is in the right hepatic dome, measuring up to 6.7 x 8.7 cm. The second largest lesion is in the right hepatic lobe, measuring 6.3 x 7.8 cm. 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 Cirrhotic liver configuration with stigmata of portal hypertension including splenomegaly and trace ascites. -12/10/2023: AFP 3.2. CEA 3.2. AST 181. ALT 54. Alkaline Phosphatase 648. Hepatitis panel: Negative -12/20/2023: Left lobe mass of liver biopsy.  Pathology: Moderately differentiated hepatocellular carcinoma. The lesional cells are positive for arginase-1 and HepPar-1.  -01/05/2024: EGD: Portal hypertensive gastropathy. Gastritis. Normal duodenal bulb, first portion of the duodenum and second portion of the duodenum.  -01/12/2024: Evaluated by Dr. Philip (IR) and patient is not a candidate for Y90 with current liver function. Can consider in future if LFTs improve.  -01/10/2024: CT chest: Multiple bilateral pulmonary nodules of maximally 5 mm. Technically indeterminate but suspicious for early pulmonary metastasis. New tiny bilateral pleural effusions with dependent bibasilar airspace disease, new since the prior MRI. Dependent airspace disease could represent compressive atelectasis or early infection. No thoracic adenopathy.    Current Treatment:  Planned Atezolizumab + Bevacizumab  INTERVAL HISTORY:   [name] is here today for follow up. Patient is accompanied by her son and husband today.   She experiences reduced jaundice and itching with current medication, which has improved her sleep. However, she continues to have swelling in her feet and legs, particularly at night, despite elevating them. She also experiences anxiety at night,  affecting her sleep.  She takes Lasix  as prescribed but notes increased swelling in her feet and legs. She has a history of coronary heart disease and congestive heart failure. She experiences fatigue, dizziness, and shortness of breath with minimal exertion, such as walking to the car.   I have reviewed the past medical history, past surgical history, social history and family history with the patient and they are unchanged from previous note.  ALLERGIES:  is allergic to penicillins.  MEDICATIONS:  Current Outpatient Medications  Medication Sig Dispense Refill   carvedilol  (COREG ) 3.125 MG tablet Take 1 tablet (3.125 mg total) by mouth 2 (two) times daily. 60 tablet 11   cholestyramine  (QUESTRAN ) 4 g packet Take 1 packet (4 g total) by mouth 2 (two) times daily. 60 each 12   Cyanocobalamin (VITAMIN B-12 PO) Take by mouth. (Patient taking differently: Take by mouth daily at 6 (six) AM.)     furosemide  (LASIX ) 40 MG tablet Take 1 tablet (40 mg total) by mouth daily. 30 tablet 11   lactulose  (CHRONULAC ) 10 GM/15ML solution Take 15 mLs (10 g total) by mouth daily. (Patient taking differently: Take 10 g by mouth daily. 1 full tablespoon, and some time increases it to two Tablespoons depending on stool output.) 236 mL 4   pantoprazole  (PROTONIX ) 40 MG tablet Take 1 tablet (40 mg total) by mouth daily. 30 tablet 11   sodium chloride  (OCEAN) 0.65 % SOLN nasal spray Place 1 spray into both nostrils as needed for congestion.     spironolactone  (ALDACTONE ) 25 MG tablet Take 1 tablet (25 mg total) by mouth daily. 30 tablet 11   VITAMIN D PO Take by mouth. (Patient taking differently: Take by mouth daily at 6 (six) AM.)     No current facility-administered medications for this visit.    REVIEW  OF SYSTEMS:   Constitutional: Denies fevers, chills or abnormal weight loss Eyes: Denies blurriness of vision Ears, nose, mouth, throat, and face: Denies mucositis or sore throat Respiratory: Denies cough,  dyspnea or wheezes Cardiovascular: Denies palpitation, chest discomfort or lower extremity swelling Gastrointestinal:  Denies nausea, heartburn or change in bowel habits Skin: Denies abnormal skin rashes Lymphatics: Denies new lymphadenopathy or easy bruising Neurological:Denies numbness, tingling or new weaknesses Behavioral/Psych: Mood is stable, no new changes  All other systems were reviewed with the patient and are negative.   VITALS:  There were no vitals taken for this visit.  Wt Readings from Last 3 Encounters:  01/05/24 262 lb 5.6 oz (119 kg)  12/29/23 264 lb 8 oz (120 kg)  12/27/23 265 lb (120.2 kg)    There is no height or weight on file to calculate BMI.  Performance status (ECOG): 2 - Symptomatic, <50% confined to bed  PHYSICAL EXAM:   GENERAL:alert, no distress and comfortable SKIN: Jaundiced but improved from before EYES:  Conjunctiva Jaundiced LUNGS: clear to auscultation and percussion with normal breathing effort HEART: regular rate & rhythm and no murmurs and no lower extremity edema ABDOMEN:abdomen soft, non-tender and normal bowel sounds Musculoskeletal:no cyanosis of digits and no clubbing  NEURO: alert & oriented x 3 with fluent speech, no focal motor/sensory deficits  LABORATORY DATA:  I have reviewed the data as listed    Component Value Date/Time   NA 133 (L) 12/12/2023 0441   NA 142 05/10/2015 1257   K 3.9 12/12/2023 0441   CL 92 (L) 12/12/2023 0441   CO2 29 12/12/2023 0441   GLUCOSE 87 12/12/2023 0441   BUN 25 (H) 12/12/2023 0441   BUN 11 05/10/2015 1257   CREATININE 1.15 (H) 12/12/2023 0441   CALCIUM 8.2 (L) 12/12/2023 0441   PROT 5.3 (L) 12/12/2023 0441   ALBUMIN 1.8 (L) 12/12/2023 0441   AST 218 (H) 12/12/2023 0441   ALT 59 (H) 12/12/2023 0441   ALKPHOS 622 (H) 12/12/2023 0441   BILITOT 10.9 (H) 12/12/2023 0441   GFRNONAA 52 (L) 12/12/2023 0441   GFRAA >60 06/22/2017 1354    Lab Results  Component Value Date   WBC 10.4  12/20/2023   NEUTROABS 4.4 12/09/2023   HGB 11.6 (L) 12/20/2023   HCT 34.8 (L) 12/20/2023   MCV 92.1 12/20/2023   PLT 383 12/20/2023    Latest Reference Range & Units 01/13/24 10:49  Iron 28 - 170 ug/dL 822 (H)  UIBC ug/dL 26  TIBC 749 - 549 ug/dL 796 (L)  Saturation Ratios 10.4 - 31.8 % 87 (H)  Ferritin 11 - 307 ng/mL 582 (H)  Folate >5.9 ng/mL 4.8 (L)  Vitamin B12 180 - 914 pg/mL 1,390 (H)  (H): Data is abnormally high (L): Data is abnormally low  RADIOGRAPHIC STUDIES: I have personally reviewed the radiological images as listed and agreed with the findings in the report.  CT Chest W Contrast Addendum: ADDENDUM REPORT: 01/10/2024 14:13   ADDENDUM:  Not included above are multiple nonacute anterior right rib  fractures, including third and fourth. The fourth fracture may be  subacute, including on 61/2.   Electronically Signed    By: Rockey Kilts M.D.    On: 01/10/2024 14:13 Narrative: CLINICAL DATA:  Staging of hepatocellular carcinoma. * Tracking Code: BO *  EXAM: CT CHEST WITH CONTRAST  TECHNIQUE: Multidetector CT imaging of the chest was performed during intravenous contrast administration.  RADIATION DOSE REDUCTION: This exam was performed according to  the departmental dose-optimization program which includes automated exposure control, adjustment of the mA and/or kV according to patient size and/or use of iterative reconstruction technique.  CONTRAST:  80mL OMNIPAQUE  IOHEXOL  300 MG/ML  SOLN  COMPARISON:  MRI of the abdomen of 12/10/2023. Chest radiograph of 12/09/2023 no prior chest CT.  FINDINGS: Cardiovascular: Aortic atherosclerosis. Tortuous thoracic aorta. Mild cardiomegaly, without pericardial effusion. Lad and right coronary artery calcification.  No central pulmonary embolism, on this non-dedicated study.  Mediastinum/Nodes: No mediastinal or hilar adenopathy. Subtle periesophageal varices  Lungs/Pleura: Small left and trace right pleural  effusions.  Dependent bibasilar airspace disease is new since the prior MRI.  Left upper lobe 4 mm calcified granuloma on 59/3.  Bilateral relatively well-circumscribed/rounded pulmonary nodules are identified on series 3. Example 5 mm in the superior segment left lower lobe on 47/3. Within the right lower lobe at 4 mm on image 61/3.  Within the anterior right middle lobe at 3 mm on 76/3.  5 x 5 mm in the inferior right middle lobe on 96/3.  Upper Abdomen: Hepatic replacement by innumerable masses, as before. Portosystemic collaterals. Normal imaged portions of the spleen, stomach, pancreas, adrenal glands, kidneys.  Musculoskeletal: Anasarca.  Mild osteopenia.  IMPRESSION: 1. Multiple bilateral pulmonary nodules of maximally 5 mm. Technically indeterminate but suspicious for early pulmonary metastasis. Consider chest CT follow-up at 6 months. 2. New tiny bilateral pleural effusions with dependent bibasilar airspace disease, new since the prior MRI. Dependent airspace disease could represent compressive atelectasis or early infection. 3. No thoracic adenopathy 4. Incidental findings, including: Coronary artery atherosclerosis. Aortic Atherosclerosis (ICD10-I70.0).  Electronically Signed: By: Rockey Kilts M.D. On: 01/10/2024 13:51

## 2024-01-12 NOTE — Assessment & Plan Note (Addendum)
 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  With the local hepatocellular carcinoma, regional therapies might be an option including Y90 or radiation.  Will refer to IR for assessment. -Considering patient has innumerable lesions, if regional therapy is not an option then would consider systemic immunotherapy.  She would need an EGD prior to starting atezolizumab/ bevacizumab.  If she has Varices, will consider tremelimumab/nivolumab. - Labs reviewed today: CMP: Mild hyponatremia, elevated creatinine.  Elevated alkaline phosphatase but trending down.  Elevated LFTs.  Total bilirubin: 10.9.  CBC: Hemoglobin: 11.6, platelets: Normal.  PT: 14.7, INR: 1.1  Return to clinic after evaluation by IR for further management

## 2024-01-13 ENCOUNTER — Inpatient Hospital Stay: Attending: Oncology | Admitting: Oncology

## 2024-01-13 ENCOUNTER — Inpatient Hospital Stay

## 2024-01-13 VITALS — BP 125/67 | HR 70 | Temp 97.5°F | Resp 18 | Wt 262.3 lb

## 2024-01-13 DIAGNOSIS — C220 Liver cell carcinoma: Secondary | ICD-10-CM

## 2024-01-13 DIAGNOSIS — Z79899 Other long term (current) drug therapy: Secondary | ICD-10-CM | POA: Insufficient documentation

## 2024-01-13 DIAGNOSIS — I509 Heart failure, unspecified: Secondary | ICD-10-CM | POA: Diagnosis not present

## 2024-01-13 DIAGNOSIS — R17 Unspecified jaundice: Secondary | ICD-10-CM

## 2024-01-13 DIAGNOSIS — Z5112 Encounter for antineoplastic immunotherapy: Secondary | ICD-10-CM | POA: Diagnosis present

## 2024-01-13 DIAGNOSIS — E8809 Other disorders of plasma-protein metabolism, not elsewhere classified: Secondary | ICD-10-CM | POA: Diagnosis not present

## 2024-01-13 DIAGNOSIS — I110 Hypertensive heart disease with heart failure: Secondary | ICD-10-CM | POA: Diagnosis not present

## 2024-01-13 DIAGNOSIS — F419 Anxiety disorder, unspecified: Secondary | ICD-10-CM | POA: Diagnosis not present

## 2024-01-13 DIAGNOSIS — E538 Deficiency of other specified B group vitamins: Secondary | ICD-10-CM | POA: Insufficient documentation

## 2024-01-13 LAB — COMPREHENSIVE METABOLIC PANEL WITH GFR
ALT: 91 U/L — ABNORMAL HIGH (ref 0–44)
AST: 242 U/L — ABNORMAL HIGH (ref 15–41)
Albumin: <1.5 g/dL — ABNORMAL LOW (ref 3.5–5.0)
Alkaline Phosphatase: 748 U/L — ABNORMAL HIGH (ref 38–126)
Anion gap: 13 (ref 5–15)
BUN: 33 mg/dL — ABNORMAL HIGH (ref 8–23)
CO2: 28 mmol/L (ref 22–32)
Calcium: 8.3 mg/dL — ABNORMAL LOW (ref 8.9–10.3)
Chloride: 87 mmol/L — ABNORMAL LOW (ref 98–111)
Creatinine, Ser: 1.63 mg/dL — ABNORMAL HIGH (ref 0.44–1.00)
GFR, Estimated: 34 mL/min — ABNORMAL LOW (ref >60)
Glucose, Bld: 121 mg/dL — ABNORMAL HIGH (ref 70–99)
Potassium: 3.9 mmol/L (ref 3.5–5.1)
Sodium: 128 mmol/L — ABNORMAL LOW (ref 135–145)
Total Bilirubin: 13.8 mg/dL — ABNORMAL HIGH (ref 0.0–1.2)
Total Protein: 5.3 g/dL — ABNORMAL LOW (ref 6.5–8.1)

## 2024-01-13 LAB — CBC WITH DIFFERENTIAL/PLATELET
Abs Immature Granulocytes: 0.04 K/uL (ref 0.00–0.07)
Basophils Absolute: 0 K/uL (ref 0.0–0.1)
Basophils Relative: 1 %
Eosinophils Absolute: 0.2 K/uL (ref 0.0–0.5)
Eosinophils Relative: 3 %
HCT: 28.7 % — ABNORMAL LOW (ref 36.0–46.0)
Hemoglobin: 9.8 g/dL — ABNORMAL LOW (ref 12.0–15.0)
Immature Granulocytes: 1 %
Lymphocytes Relative: 20 %
Lymphs Abs: 1.1 K/uL (ref 0.7–4.0)
MCH: 32.1 pg (ref 26.0–34.0)
MCHC: 34.1 g/dL (ref 30.0–36.0)
MCV: 94.1 fL (ref 80.0–100.0)
Monocytes Absolute: 0.6 K/uL (ref 0.1–1.0)
Monocytes Relative: 10 %
Neutro Abs: 3.7 K/uL (ref 1.7–7.7)
Neutrophils Relative %: 65 %
Platelets: 357 K/uL (ref 150–400)
RBC: 3.05 MIL/uL — ABNORMAL LOW (ref 3.87–5.11)
RDW: 17.2 % — ABNORMAL HIGH (ref 11.5–15.5)
WBC: 5.5 K/uL (ref 4.0–10.5)
nRBC: 0 % (ref 0.0–0.2)

## 2024-01-13 LAB — IRON AND TIBC
Iron: 177 ug/dL — ABNORMAL HIGH (ref 28–170)
Saturation Ratios: 87 % — ABNORMAL HIGH (ref 10.4–31.8)
TIBC: 203 ug/dL — ABNORMAL LOW (ref 250–450)
UIBC: 26 ug/dL

## 2024-01-13 LAB — VITAMIN B12: Vitamin B-12: 1390 pg/mL — ABNORMAL HIGH (ref 180–914)

## 2024-01-13 LAB — FERRITIN: Ferritin: 582 ng/mL — ABNORMAL HIGH (ref 11–307)

## 2024-01-13 LAB — FOLATE: Folate: 4.8 ng/mL — ABNORMAL LOW (ref >5.9)

## 2024-01-13 MED ORDER — FOLIC ACID 1 MG PO TABS: 1.0000 mg | ORAL_TABLET | Freq: Every day | ORAL | 2 refills | Status: DC

## 2024-01-13 MED ORDER — HYDROXYZINE PAMOATE 50 MG PO CAPS: 50.0000 mg | ORAL_CAPSULE | Freq: Every day | ORAL | 2 refills | Status: AC

## 2024-01-13 NOTE — Assessment & Plan Note (Signed)
 Will prescribe hydroxyzine  50 mg nightly as needed (limited options considering poor LFTs)

## 2024-01-13 NOTE — Assessment & Plan Note (Addendum)
 Patient has elevated bilirubin, elevated ALP. MRCP consistent with innumerable liver lesions and probable IPMN Itching improved with cholestyramine . Jaundice improved  - Currently taking lactulose  and rifaximin -Continue cholestyramine  for itching

## 2024-01-13 NOTE — Assessment & Plan Note (Addendum)
 Serum folate <5  - Prescribed folic acid  1mg  daily

## 2024-01-13 NOTE — Addendum Note (Signed)
 Addended by: Jakevion Arney on: 01/13/2024 11:48 PM   Modules accepted: Orders

## 2024-01-13 NOTE — Patient Instructions (Signed)
 [facility] Discharge Instructions   You were seen and examined today by Dr. Davonna.  She discussed with you treatment for your hepatocellular cancer with 2 different medications. These medications are not chemotherapy, but are a class of medications called monoclonal antibodies. One interferes with the blood supply to the cancer and the other works with your body's own immune system to help fight the cancer. The medications are called Avastin and Tecentriq.    Return as scheduled.    Thank you for choosing [facility] at [facility] to provide your oncology and hematology care.  To afford each patient quality time with our provider, please arrive at least 15 minutes before your scheduled appointment time.   If you have a lab appointment with the Cancer Center please come in thru the Main Entrance and check in at the main information desk.  You need to re-schedule your appointment should you arrive 10 or more minutes late.  We strive to give you quality time with our providers, and arriving late affects you and other patients whose appointments are after yours.  Also, if you no show three or more times for appointments you may be dismissed from the clinic at the providers discretion.     Again, thank you for choosing [facility].  Our hope is that these requests will decrease the amount of time that you wait before being seen by our physicians.       _____________________________________________________________  Should you have questions after your visit to [facility], please contact our office at [phone] and follow the prompts.  Our office hours are 8:00 a.m. and 4:30 p.m. Monday - Friday.  Please note that voicemails left after 4:00 p.m. may not be returned until the following business day.  We are closed weekends and major holidays.  You do have access to a nurse 24-7, just call the main number to the  clinic [phone] and do not press any options, hold on the line and a nurse will answer the phone.    For prescription refill requests, have your pharmacy contact our office and allow 72 hours.    Due to Covid, you will need to wear a mask upon entering the hospital. If you do not have a mask, a mask will be given to you at the Main Entrance upon arrival. For doctor visits, patients may have 1 support person age 79 or older with them. For treatment visits, patients can not have anyone with them due to social distancing guidelines and our immunocompromised population.

## 2024-01-13 NOTE — Assessment & Plan Note (Addendum)
 Likely secondary to malnutrition and liver cirrhosis  - Nutrition referral for support

## 2024-01-13 NOTE — Progress Notes (Signed)
 START ON PATHWAY REGIMEN - Hepatobiliary     A cycle is every 21 days:     Atezolizumab      Bevacizumab-xxxx   **Always confirm dose/schedule in your pharmacy ordering system**  Patient Characteristics: Hepatocellular Carcinoma, Unresectable or Nonsurgical Candidate, Locally Advanced or Metastatic Disease Not Amenable to Locoregional Therapy, Systemic Therapy, First Line, No Prior Transplant and Candidate for Immunotherapy Hepatobiliary Disease Type: Hepatocellular Carcinoma Line of Therapy: First Line Intent of Therapy: Non-Curative / Palliative Intent, Discussed with Patient

## 2024-01-14 ENCOUNTER — Encounter: Payer: Self-pay | Admitting: Oncology

## 2024-01-14 ENCOUNTER — Other Ambulatory Visit: Payer: Self-pay

## 2024-01-14 MED ORDER — ONDANSETRON HCL 8 MG PO TABS: 8.0000 mg | ORAL_TABLET | Freq: Three times a day (TID) | ORAL | 1 refills | Status: DC | PRN

## 2024-01-14 MED ORDER — PROCHLORPERAZINE MALEATE 10 MG PO TABS: 10.0000 mg | ORAL_TABLET | Freq: Four times a day (QID) | ORAL | 1 refills | Status: DC | PRN

## 2024-01-14 MED ORDER — LIDOCAINE-PRILOCAINE 2.5-2.5 % EX CREA: TOPICAL_CREAM | CUTANEOUS | 3 refills | Status: DC

## 2024-01-14 NOTE — Patient Instructions (Signed)
 [facility] Chemotherapy Teaching   You have been diagnosed with Stage III Hepatocellular cancer by your oncologist.  Yo will receive the drugs Avastin and Tecentriq.  The intent of treatment is Palliative meaning to prevent the spread and manage your symptoms.   You will see the doctor regularly throughout treatment.  We will obtain blood work from you prior to every treatment and monitor your results to make sure it is safe to give your treatment. The doctor monitors your response to treatment by the way you are feeling, your blood work, and by obtaining scans periodically.  There will be wait times while you are here for treatment.  It will take about 30 minutes to 1 hour for your lab work to result.  Then there will be wait times while pharmacy mixes your medications.    Bevacizumab (Avastin)  About This Drug Bevacizumab is used to treat cancer. It is given in the vein (IV). It will take 30 minutes to infuse.   Possible Side Effects  Teary eyes   Runny/stuffy nose   Nosebleed   Changes in the way food and drinks taste   Headache   Back pain   Protein in your urine   Bleeding in your rectum   Dry skin   A red skin rash which can be peeling or scaling   High blood pressure  Note: Each of the side effects above was reported in 10% or greater of patients treated with bevacizumab-xxxx. Not all possible side effects are included above.  Warnings and Precautions   Perforation or fistula- an abnormal hole in your stomach, intestine, esophagus, or other organ, which can be life-threatening  Slow wound healing, which can be life-threatening  Abnormal bleeding which can be life-threatening - symptoms may be coughing up blood, throwing up blood (may look like coffee grounds), red or black tarry bowel movements, abnormally heavy menstrual flow, nosebleeds or any other unusual bleeding.  Blood clots and events such as stroke and heart attack. A blood clot in your leg  may cause your leg to swell, appear red and warm, and/or cause pain. A blood clot in your lungs may cause trouble breathing, pain when breathing, and/or chest pain.  Severe high blood pressure  Changes in your central nervous system can happen. The central nervous system is made up of your brain and spinal cord. You could feel extreme tiredness, agitation, confusion, hallucinations   This drug may interact with other medicines. Tell your doctor and pharmacist about all the medicines and dietary supplements (vitamins, minerals, herbs and others) that you are taking at this time. Also, check with your doctor or pharmacist before starting any new prescription or over-the-counter medicines, or dietary supplements to make sure that there are no interactions.  When to Call the Doctor Call your doctor or nurse if you have any of these symptoms and/or any new or unusual symptoms:   Fever of 100.4 F (38 C) or higher   Chills   Confusion and/or agitation   Hallucinations   Trouble understanding or speaking   Headache that does not go away   Nose bleed that doesn't stop bleeding after 10 -15 minutes   Feeling dizzy or lightheaded   Blurry vision or changes in your eyesight   Difficulty swallowing   Easy bleeding or bruising   Blood in your urine, vomit (bright red or coffee-ground) and/or stools ( bright red, or black/tarry)   Coughing up blood   Wheezing and/or trouble breathing  Chest pain or symptoms of a heart attack. Most heart attacks involve pain in the center of the chest that lasts more than a few minutes. The pain may go away and come back. It can feel like pressure, squeezing, fullness, or pain. Sometimes pain is felt in one or both arms, the back, neck, jaw, or stomach. If any of these symptoms last 2 minutes, call 911.   Symptoms of a stroke such as sudden numbness or weakness of your face, arm, or leg, mostly on one side of your body; sudden confusion, trouble speaking or  understanding; sudden trouble seeing in one or both eyes; sudden trouble walking, feeling dizzy, loss of balance or coordination; or sudden, bad headache with no known cause. If you have any of these symptoms for 2 minutes, call 911.   Numbness or lack of strength to your arms, legs, face, or body   Nausea that stops you from eating or drinking and/or relieved by prescribed medicine   Throwing up   Pain in your abdomen that does not go away   Foamy or bubbly-looking urine   Signs of infusion reaction: fever or shaking chills, flushing, facial swelling, feeling dizzy, headache, trouble breathing, rash, itching, chest tightness, or chest pain.   Pain that does not go away or is not relieved by prescribed medicine   Your leg or arm is swollen, red, warm and/or painful   Swelling of arms, hands, legs and/or feet   Weight gain of 5 pounds in one week (fluid retention)   If you think you may be pregnant  Reproduction Warnings  Pregnancy warning: This drug can have harmful effects on the unborn baby. Women of child bearing potential should use effective methods of birth control during your cancer treatment and for 6 months after treatment. In women, changes in your ovaries may happen that may cause menstrual bleeding to become irregular or stop, do not assume you cannot get pregnant. Let your doctor know right away if you think you may be pregnant   Breastfeeding warning: Women should not breastfeed during treatment and for 6 months after treatment because this drug could enter the breast milk and cause harm to a breastfeeding baby.    Fertility warning: In women, this drug may affect your ability to have children in the future. Talk with your doctor or nurse if you plan to have children. Ask for information on egg banking.   Atezolizumab Melene)  About This Drug Atezolizumab is used to treat cancer. It is given by the vein (IV). The first infusion takes one hour. All subsequent  infusions will be given over 30 minutes.   Possible Side Effects  Nausea  Tiredness and weakness  Decreased appetite (decreased hunger)  Cough  Trouble breathing  Note: Each of the side effects above was reported in 20% or greater of patients treated with atezolizumab. Your side effects may be different if you are taking atezolizumab in combination with another agent. Not all possible side effects are included above.  Warnings and Precautions  This drug works with your immune system and can cause inflammation (swelling) in any of your organs and tissues and can change how they work. This may put you at risk for developing serious medical problems, which can be life-threatening.   Inflammation of the lungs which can be life-threatening. You may have a dry cough or trouble breathing.   Severe changes in your liver function which can cause liver failure and be life-threatening.   Colitis which is  swelling in the colon. The symptoms are diarrhea, stomach cramping, and sometimes blood in the bowel movements.   Changes in your central nervous system can happen. The central nervous system is made up of your brain and spinal cord. You could feel extreme tiredness, agitation, confusion, hallucinations (see or hear things that are not there), trouble understanding or speaking, loss of control of your bowels or bladder, eyesight changes, numbness or lack of strength to your arms, legs, face, or body, and coma. If you start to have any of these symptoms let your doctor know right away.   This drug may affect your hormone glands (thyroid, adrenals, pituitary and pancreas).   Blood sugar levels may change, and you may develop diabetes. If you already have diabetes, changes may need to be made to your diabetes medication.   Severe infections, including viral, bacterial and fungal, which can be life-threatening   While you are getting this drug in your vein (IV), you may have a reaction to the drug.  Sometimes you may be given medication to stop or lessen these side effects. Your nurse will check you closely for these signs: fever or shaking chills, flushing, facial swelling, feeling dizzy, headache, trouble breathing, rash, itching, chest tightness, or chest pain. These reactions may happen after your infusion. If this happens, call 911 for emergency care.  Note: Some of the side effects above are very rare. If you have concerns and/or questions, please discuss them with your medical team.  Important Information  This drug may be present in the saliva, tears, sweat, urine, stool, vomit, semen, and vaginal secretions. Talk to your doctor and/or your nurse about the necessary precautions to take during this time.   Treating Side Effects   Manage tiredness by pacing your activities for the day.   Be sure to include periods of rest between energy-draining activities.   To decrease the risk of infection, wash your hands regularly.   Avoid close contact with people who have a cold, the flu, or other infections.   Take your temperature as your doctor or nurse tells you, and whenever you feel like you may have a fever.   Drink plenty of fluids (a minimum of eight glasses per day is recommended).   Ask your doctor or nurse about medicine that is available to help stop or lessen loose bowel movements.   To help with nausea, eat small, frequent meals instead of three large meals a day. Choose foods and drinks that are at room temperature. Ask your nurse or doctor about other helpful tips and medicine that is available to help stop or lessen these symptoms.  To help with decreased appetite, eat small, frequent meals. Eat foods high in calories and protein, such as meat, poultry, fish, dry beans, tofu, eggs, nuts, milk, yogurt, cheese, ice cream, pudding, and nutritional supplements.   Consider using sauces and spices to increase taste. Daily exercise, with your doctor's approval, may increase your  appetite.   If you have diabetes, keep good control of your blood sugar level. Tell your nurse or your doctor if your glucose levels are higher or lower than normal.   Keeping your pain under control is important to your well-being. Please tell your doctor or nurse if you are experiencing pain.   If you get a rash do not put anything on it unless your doctor or nurse says you may. Keep the area around the rash clean and dry. Ask your doctor for medicine if your rash bothers  you.   If you have numbness and tingling in your hands and feet, be careful when cooking, walking, and handling sharp objects and hot liquids.   Infusion reactions may happen after your infusion. If this happens, call 911 for emergency care.  Food and Drug Interactions  There are no known interactions of atezolizumab with food.   This drug may interact with other medicines. Tell your doctor and pharmacist about all the prescription and over-the-counter medicines and dietary supplements (vitamins, minerals, herbs and others) that you are taking at this time. Also, check with your doctor or pharmacist before starting any new prescription or over-the-counter medicines, or dietary supplements to make sure that there are no interactions.  When to Call the Doctor  Call your doctor or nurse if you have any of these symptoms and/or any new or unusual symptoms:   Fever of 100.4 F (38 C) or higher   Chills   Tiredness that interferes with your daily activities   Feeling dizzy or lightheaded   Pain in your chest   Dry cough   Coughing up yellow, green, or bloody mucus   Wheezing or trouble breathing   Feeling that your heart is beating in a fast or not normal way (palpitations)   Confusion and/or agitation   Hallucinations   Trouble understanding or speaking   Numbness or lack of strength to your arms, legs, face, or body   Blurred vision or other changes in eyesight   Diarrhea, 4 times in one day or  diarrhea with lack of strength or a feeling of being dizzy   Pain in your abdomen that does not go away   Blood in your stool   Nausea that stops you from eating or drinking and/or is not relieved by prescribed medicines   Throwing up   Lasting loss of appetite or rapid weight loss of five pounds in a week   Abnormal blood sugar   Unusual thirst, passing urine often, headache, sweating, shakiness, irritability   Pain that does not go away, or is not relieved by prescribed medicines   Numbness, tingling, or pain your hands and feet   Extreme weakness that interferes with normal activities   A new rash or a rash that is not relieved by prescribed medicines   Signs of infusion reaction: fever or shaking chills, flushing, facial swelling, feeling dizzy, headache, trouble breathing, rash, itching, chest tightness, or chest pain. If this happens, call 911 for emergency care.   Signs of possible liver problems: dark urine, pale bowel movements, bad stomach pain, feeling very tired and weak, unusual itching, or yellowing of the eyes or skin   If you think you may be pregnant  Reproduction Warnings   Pregnancy warning: This drug can have harmful effects on the unborn baby. Women of childbearing potential should use effective methods of birth control during your cancer treatment and for at least 5 months after treatment. Let your doctor know right away if you think you may be pregnant.   Breastfeeding warning: It is not known if this drug passes into breast milk. For this reason, Women should not breastfeed during treatment and for at least 5 months after treatment because this drug could enter the breast milk and cause harm to a breastfeeding baby.   Fertility warning: In women, this drug may affect your ability to have children in the future. Talk with your doctor or nurse if you plan to have children. Ask for information on egg banking.  SELF CARE ACTIVITIES WHILE ON  IMMUNOTHERAPY:  Hydration Increase your fluid intake and drink at least 8 to 12 cups (64 ounces) of water/decaffeinated beverages per day after treatment. You can still have your cup of coffee or soda but these beverages do not count as part of your 8 to 12 cups that you need to drink daily. No alcohol intake.  Medications Continue taking your normal prescription medication as prescribed.  If you start any new herbal or new supplements please let us  know first to make sure it is safe.  Skin Care Always use sunscreen that has not expired and with SPF (Sun Protection Factor) of 50 or higher. Wear hats to protect your head from the sun. Remember to use sunscreen on your hands, ears, face, & feet.  Use good moisturizing lotions such as udder cream, eucerin, or even Vaseline. Some chemotherapies can cause dry skin, color changes in your skin and nails.    Avoid long, hot showers or baths. Use gentle, fragrance-free soaps and laundry detergent. Use moisturizers, preferably creams or ointments rather than lotions because the thicker consistency is better at preventing skin dehydration. Apply the cream or ointment within 15 minutes of showering. Reapply moisturizer at night, and moisturize your hands every time after you wash them.  Infection Prevention Please wash your hands for at least 30 seconds using warm soapy water. Handwashing is the #1 way to prevent the spread of germs. Stay away from sick people or people who are getting over a cold. If you develop respiratory systems such as green/yellow mucus production or productive cough or persistent cough let us  know and we will see if you need an antibiotic. It is a good idea to keep a pair of gloves on when going into grocery stores/[employer] to decrease your risk of coming into contact with germs on the carts, etc. Carry alcohol hand gel with you at all times and use it frequently if out in public. If your temperature reaches 100.5 or higher please call the  clinic and let us  know.  If it is after hours or on the weekend please go to the ER if your temperature is over 100.4.  Please have your own personal thermometer at home to use.    Sex and bodily fluids If you are going to have sex, a condom must be used to protect the person that isn't taking immunotherapy. For a few days after treatment, immunotherapy can be excreted through your bodily fluids.  When using the toilet please close the lid and flush the toilet twice.  Do this for a few day after you have had immunotherapy.   Contraception It is not known for sure whether or not immunotherapy drugs can be passed on through semen or secretions from the vagina. Because of this some doctors advise people to use a barrier method if you have sex during treatment. This applies to vaginal, anal or oral sex.  Generally, doctors advise a barrier method only for the time you are actually having the treatment and for about a week after your treatment.  Advice like this can be worrying, but this does not mean that you have to avoid being intimate with your partner. You can still have close contact with your partner and continue to enjoy sex.   Foods to avoid Some foods have a higher risk of becoming tainted with bacteria. These include: Unwashed fresh fruit and vegetables, especially leafy vegetables that can hide dirt and other contaminants Raw sprouts, such as alfalfa  sprouts Raw or undercooked beef, especially ground beef, or other raw or undercooked meat and poultry Fatty, fried, or spicy foods immediately before or after treatment.  These can sit heavy on your stomach and make you feel nauseous. Raw or undercooked shellfish, such as oysters. Sushi and sashimi, which often contain raw fish.  Unpasteurized beverages, such as unpasteurized fruit juices, raw milk, raw yogurt, or cider Undercooked eggs, such as soft boiled, over easy, and poached; raw, unpasteurized eggs; or foods made with raw egg, such as  homemade raw cookie dough and homemade mayonnaise  Simple steps for food safety  Shop smart. Do not buy food stored or displayed in an unclean area. Do not buy bruised or damaged fruits or vegetables. Do not buy cans that have cracks, dents, or bulges. Pick up foods that can spoil at the end of your shopping trip and store them in a cooler on the way home.  Prepare and clean up foods carefully. Rinse all fresh fruits and vegetables under running water, and dry them with a clean towel or paper towel. Clean the top of cans before opening them. After preparing food, wash your hands for 20 seconds with hot water and soap. Pay special attention to areas between fingers and under nails. Clean your utensils and dishes with hot water and soap. Disinfect your kitchen and cutting boards using 1 teaspoon of liquid, unscented bleach mixed into 1 quart of water.    Dispose of old food. Eat canned and packaged food before its expiration date (the "use by" or "best before" date). Consume refrigerated leftovers within 3 to 4 days. After that time, throw out the food. Even if the food does not smell or look spoiled, it still may be unsafe. Some bacteria, such as Listeria, can grow even on foods stored in the refrigerator if they are kept for too long.  Take precautions when eating out. At restaurants, avoid buffets and salad bars where food sits out for a long time and comes in contact with many people. Food can become contaminated when someone with a virus, often a norovirus, or another "bug" handles it. Put any leftover food in a "to-go" container yourself, rather than having the server do it. And, refrigerate leftovers as soon as you get home. Choose restaurants that are clean and that are willing to prepare your food as you order it cooked.    SYMPTOMS TO REPORT AS SOON AS POSSIBLE AFTER TREATMENT:  FEVER GREATER THAN 100.4 F CHILLS WITH OR WITHOUT FEVER UNUSUAL SHORTNESS OF BREATH URINARY  PROBLEMS BOWEL PROBLEMS (uncontrolled diarrhea) UNUSUAL RASH     Wear comfortable clothing and clothing appropriate for easy access to any Portacath or PICC line. Let us  know if there is anything that we can do to make your therapy better!   What to do if you need assistance after hours or on the weekends: CALL [phone].  HOLD on the line, do not hang up.  You will hear multiple messages but at the end you will be connected with a nurse triage line.  They will contact the doctor if necessary.  Most of the time they will be able to assist you.  Do not call the hospital operator.    I have been informed and understand all of the instructions given to me and have received a copy. I have been instructed to call the clinic [phone] or my family physician as soon as possible for continued medical care, if indicated. I do not have  any more questions at this time but understand that I may call the Cancer Center or the Patient Navigator at [phone] during office hours should I have questions or need assistance in obtaining follow-up care.

## 2024-01-17 ENCOUNTER — Inpatient Hospital Stay

## 2024-01-17 ENCOUNTER — Inpatient Hospital Stay: Admitting: Dietician

## 2024-01-17 DIAGNOSIS — C220 Liver cell carcinoma: Secondary | ICD-10-CM

## 2024-01-17 NOTE — Progress Notes (Signed)

## 2024-01-17 NOTE — Progress Notes (Signed)
 Pharmacist Chemotherapy Monitoring - Initial Assessment    Anticipated start date: 01/21/24   The following has been reviewed per standard work regarding the patient's treatment regimen: The patient's diagnosis, treatment plan and drug doses, and organ/hematologic function Lab orders and baseline tests specific to treatment regimen  The treatment plan start date, drug sequencing, and pre-medications Prior authorization status  Patient's documented medication list, including drug-drug interaction screen and prescriptions for anti-emetics and supportive care specific to the treatment regimen The drug concentrations, fluid compatibility, administration routes, and timing of the medications to be used The patient's access for treatment and lifetime cumulative dose history, if applicable  The patient's medication allergies and previous infusion related reactions, if applicable   Changes made to treatment plan:  N/A  Follow up needed:  N/A  Bevacizumab - holding with cycle 1 due to port placement.   [name] Molt, RPH, 01/17/2024  1:46 PM

## 2024-01-17 NOTE — Progress Notes (Signed)
 Nutrition Assessment   Reason for Assessment: Referral    ASSESSMENT: 68 year old female with stage III HCC. She is planning to start Atezolizumab  + Bevacizumab q21d (first 9/19). Patient is under the care of Dr. Davonna.   Past medical history includes portal HTN, sCHF, atrial fibrillation, liver cirrhosis secondary to NASH, folate deficiency, jaundice, anxiety  8/7-8/10 admission with CHF  Met with patient, husband, and nephew in clinic. Pt in wheelchair on 2L. Reports feeling wiped out after getting dressed this morning. Pt increased O2 to 3L due to dyspnea. Juandice appreciated, however this is improved per pt/family. Endorses fatigue, weakness, and ongoing BLE edema despite lasix .   Appetite has been poor since discharge. Husband reports following strict low sodium diet. States the doctor instructed no processed foods, no canned vegetables, no more than 2g sodium daily. Some days, pt getting in ~500 mg sodium. She is taking lasix  40mg /day as prescribed and drinking water all day. Patient reports early satiety. Tolerating 2-3 small portions daily. Recalls one egg, whole wheat toast/jelly for breakfast. Occasionally has had one piece of [place] sausage. Last night pt had 2-3 oz portion of pork chop cooked in crockpot with frozen vegetables. If she has a snack, it is typically grapes, strawberries, or a pudding cup.   Patient denies nausea or vomiting. Having daily bowel movements with miralax  plus lactulose .  Nutrition Focused Physical Exam: deferred    Medications: coreg , questran , B12, folvite , lasix  40 mg, hydroxyzine , lactulose , zofran , protonix , compazine , aldactone , vit D   Labs: 9/11 - Na 128, glucose 121, BUN 33, Cr 1.63, albumin 1.5   Anthropometrics: Actual wt masked by fluid   Height: 5'5 Weight: 262 lb 5.6 (9/11) UBW: 250 lb (per pt) BMI: 43.66   NUTRITION DIAGNOSIS: Inadequate oral intake related to [facility] as evidenced by reported early satiety, dietary  recall  INTERVENTION:  Discussed concerns of hyponatremia, recommend increasing dietary sodium Discussed strategies for early satiety, encourage 4-6 small meals and including lean proteins at every meal - ideas offered, handouts provided  Encourage protein shake - samples of Ensure Max, Ensure HP provided + coupons All questions answered  Contact information provided    MONITORING, EVALUATION, GOAL: Pt will tolerate adequate calories and protein to minimize wt loss during treatment    Next Visit: Monday September 22 via telephone

## 2024-01-18 ENCOUNTER — Encounter: Payer: Self-pay | Admitting: Oncology

## 2024-01-19 ENCOUNTER — Inpatient Hospital Stay: Admitting: Licensed Clinical Social Worker

## 2024-01-19 DIAGNOSIS — C220 Liver cell carcinoma: Secondary | ICD-10-CM

## 2024-01-19 NOTE — Progress Notes (Signed)
 [facility] Clinical Social Work  Initial Assessment   [name] is a 68 y.o. year old female contacted by phone. Clinical Social Work was referred by medical provider for assessment of psychosocial needs.   SDOH (Social Determinants of Health) assessments performed: Yes   SDOH Screenings   Food Insecurity: No Food Insecurity (12/09/2023)  Housing: Low Risk  (12/09/2023)  Transportation Needs: No Transportation Needs (12/09/2023)  Utilities: Not At Risk (12/09/2023)  Depression (PHQ2-9): Medium Risk (01/13/2024)  Social Connections: Socially Integrated (12/09/2023)  Tobacco Use: Low Risk  (01/05/2024)    PHQ 2/9:    01/13/2024   10:13 AM 12/27/2023   11:12 AM 05/10/2015   11:50 AM  Depression screen PHQ 2/9  Decreased Interest 3 3 0  Down, Depressed, Hopeless 0 0 0  PHQ - 2 Score 3 3 0  Altered sleeping 0 0   Tired, decreased energy 3 3   Change in appetite 3 3   Feeling bad or failure about yourself  0    Trouble concentrating 0    Moving slowly or fidgety/restless 0    Suicidal thoughts 0    PHQ-9 Score 9 9   Difficult doing work/chores Somewhat difficult Somewhat difficult      Distress Screen completed: Yes    01/17/2024   11:28 AM  ONCBCN DISTRESS SCREENING  How much distress have you been experiencing in the past week? (0-10) 3  Emotional concerns type Worry or anxiety      Family/Social Information:  Housing Arrangement: patient lives with her husband.  Family members/support persons in your life? Pt reports her nephew lives close by, he and his wife (who works in medicine) assist as needed and accompany pt to doctor's appointments. Transportation concerns: no  Employment: Retired .  Income source: Actor concerns: No Type of concern: None Food access concerns: no Religious or spiritual practice: Yes-[employer] Advanced directives: Not known Services Currently in place:  none  Coping/ Adjustment to diagnosis: Patient understands treatment  plan and what happens next? yes Concerns about diagnosis and/or treatment: Overwhelmed by information, Afraid of cancer, Quality of life, and pt's energy is significantly diminished.  Pt expressed concern about her husband needing to physically assist her if she needs to get up to do anything. Patient reported stressors: Anxiety/ nervousness, Adjusting to my illness, Facing my mortality, and Physical issues Hopes and/or priorities: pt's priority is to start treatment w/ the hope of positive results Patient enjoys time with family/ friends Current coping skills/ strengths: Motivation for treatment/growth  and Supportive family/friends     SUMMARY: Current SDOH Barriers:  ADL IADL limitations  Clinical Social Work Clinical Goal(s):  No clinical social work goals at this time  Interventions: Discussed common feeling and emotions when being diagnosed with cancer, and the importance of support during treatment Informed patient of the support team roles and support services at [facility] Provided CSW contact information and encouraged patient to call with any questions or concerns Referred patient to community resources: Wadie Rung  Discussed FMLA for pt's husband.  Per pt her husband's FMLA has been approved through the end of October.    Follow Up Plan: Patient will contact CSW with any support or resource needs Patient verbalizes understanding of plan: Yes    Tracy JONELLE Manna, LCSW Clinical Social Worker [facility]

## 2024-01-21 ENCOUNTER — Inpatient Hospital Stay

## 2024-01-21 ENCOUNTER — Other Ambulatory Visit: Payer: Self-pay | Admitting: Oncology

## 2024-01-21 ENCOUNTER — Other Ambulatory Visit: Payer: Self-pay

## 2024-01-21 VITALS — BP 108/56 | HR 62 | Temp 97.5°F | Resp 20

## 2024-01-21 DIAGNOSIS — C220 Liver cell carcinoma: Secondary | ICD-10-CM

## 2024-01-21 DIAGNOSIS — Z5112 Encounter for antineoplastic immunotherapy: Secondary | ICD-10-CM | POA: Diagnosis not present

## 2024-01-21 LAB — URINALYSIS, DIPSTICK ONLY
Glucose, UA: NEGATIVE mg/dL
Ketones, ur: NEGATIVE mg/dL
Nitrite: POSITIVE — AB
Protein, ur: NEGATIVE mg/dL
Specific Gravity, Urine: 1.016 (ref 1.005–1.030)
pH: 5 (ref 5.0–8.0)

## 2024-01-21 LAB — COMPREHENSIVE METABOLIC PANEL WITH GFR
ALT: 135 U/L — ABNORMAL HIGH (ref 0–44)
AST: 417 U/L — ABNORMAL HIGH (ref 15–41)
Albumin: 1.6 g/dL — ABNORMAL LOW (ref 3.5–5.0)
Alkaline Phosphatase: 793 U/L — ABNORMAL HIGH (ref 38–126)
Anion gap: 15 (ref 5–15)
BUN: 44 mg/dL — ABNORMAL HIGH (ref 8–23)
CO2: 29 mmol/L (ref 22–32)
Calcium: 8.7 mg/dL — ABNORMAL LOW (ref 8.9–10.3)
Chloride: 87 mmol/L — ABNORMAL LOW (ref 98–111)
Creatinine, Ser: 1.91 mg/dL — ABNORMAL HIGH (ref 0.44–1.00)
GFR, Estimated: 28 mL/min — ABNORMAL LOW (ref >60)
Glucose, Bld: 95 mg/dL (ref 70–99)
Potassium: 4.3 mmol/L (ref 3.5–5.1)
Sodium: 131 mmol/L — ABNORMAL LOW (ref 135–145)
Total Bilirubin: 16.1 mg/dL — ABNORMAL HIGH (ref 0.0–1.2)
Total Protein: 5.6 g/dL — ABNORMAL LOW (ref 6.5–8.1)

## 2024-01-21 LAB — CBC WITH DIFFERENTIAL/PLATELET
Abs Immature Granulocytes: 0.21 K/uL — ABNORMAL HIGH (ref 0.00–0.07)
Basophils Absolute: 0.1 K/uL (ref 0.0–0.1)
Basophils Relative: 1 %
Eosinophils Absolute: 0.6 K/uL — ABNORMAL HIGH (ref 0.0–0.5)
Eosinophils Relative: 7 %
HCT: 28.6 % — ABNORMAL LOW (ref 36.0–46.0)
Hemoglobin: 9.5 g/dL — ABNORMAL LOW (ref 12.0–15.0)
Immature Granulocytes: 3 %
Lymphocytes Relative: 21 %
Lymphs Abs: 1.7 K/uL (ref 0.7–4.0)
MCH: 31.1 pg (ref 26.0–34.0)
MCHC: 33.2 g/dL (ref 30.0–36.0)
MCV: 93.8 fL (ref 80.0–100.0)
Monocytes Absolute: 1 K/uL (ref 0.1–1.0)
Monocytes Relative: 12 %
Neutro Abs: 4.6 K/uL (ref 1.7–7.7)
Neutrophils Relative %: 56 %
Platelets: 420 K/uL — ABNORMAL HIGH (ref 150–400)
RBC: 3.05 MIL/uL — ABNORMAL LOW (ref 3.87–5.11)
RDW: 17.5 % — ABNORMAL HIGH (ref 11.5–15.5)
WBC: 8.1 K/uL (ref 4.0–10.5)
nRBC: 0 % (ref 0.0–0.2)

## 2024-01-21 LAB — TSH: TSH: 17.627 u[IU]/mL — ABNORMAL HIGH (ref 0.350–4.500)

## 2024-01-21 MED ORDER — SODIUM CHLORIDE 0.9 % IV SOLN: INTRAVENOUS | Status: DC

## 2024-01-21 MED ORDER — SODIUM CHLORIDE 0.9 % IV SOLN
1200.0000 mg | Freq: Once | INTRAVENOUS | Status: AC
  Administered 2024-01-21: 1200 mg via INTRAVENOUS
  Filled 2024-01-21: qty 20

## 2024-01-21 MED ORDER — LEVOTHYROXINE SODIUM 50 MCG PO TABS: 50.0000 ug | ORAL_TABLET | Freq: Every day | ORAL | 2 refills | Status: AC

## 2024-01-21 NOTE — Progress Notes (Signed)
 Labs reviewed with MD and treatment team. Per MD to proceed with treatment.  Urine results reviewed with MD. No new orders given at this time.   Patient tolerated treatment with no complaints voiced.  Side effects with management reviewed with understanding verbalized.  IV site clean and dry with no bruising or swelling noted at site.  Good blood return noted before and after administration of treatment.   Patient left in satisfactory condition with VSS and no s/s of distress noted. All follow ups as scheduled.    Jesten Cappuccio

## 2024-01-21 NOTE — Progress Notes (Signed)
 Ok to treat with Scr 1.91, TBili 16.1, AST 417, ALT 135 per MD.  Bridgett Leotis Helling, RPH, BCPS, BCOP 01/21/2024 11:26 AM

## 2024-01-21 NOTE — Progress Notes (Signed)
 Oncology Progress Note:  Labs from today with TSH: 17.627, T4 pending from today  Patient has some symptoms of fatigue, pedal edema but confounded by cirrhosis findings. Will start patient on levothyroxine  50mcg daily to be taken with an empty stomach. Discussed this with patient and her husband.   Will repeat labs in 6 weeks  Mickiel Dry, MD Hematology/Oncology [facility]

## 2024-01-21 NOTE — Patient Instructions (Signed)
 [facility] [place] - A DEPT OF [facility]  Discharge Instructions: Thank you for choosing [facility] to provide your oncology and hematology care.  If you have a lab appointment with the Cancer Center - please note that after April 8th, 2024, all labs will be drawn in the cancer center.  You do not have to check in or register with the main entrance as you have in the past but will complete your check-in in the cancer center.  Wear comfortable clothing and clothing appropriate for easy access to any Portacath or PICC line.   We strive to give you quality time with your provider. You may need to reschedule your appointment if you arrive late (15 or more minutes).  Arriving late affects you and other patients whose appointments are after yours.  Also, if you miss three or more appointments without notifying the office, you may be dismissed from the clinic at the provider's discretion.      For prescription refill requests, have your pharmacy contact our office and allow 72 hours for refills to be completed.    Today you received the following chemotherapy and/or immunotherapy agents Tecentriq    To help prevent nausea and vomiting after your treatment, we encourage you to take your nausea medication as directed.  BELOW ARE SYMPTOMS THAT SHOULD BE REPORTED IMMEDIATELY: *FEVER GREATER THAN 100.4 F (38 C) OR HIGHER *CHILLS OR SWEATING *NAUSEA AND VOMITING THAT IS NOT CONTROLLED WITH YOUR NAUSEA MEDICATION *UNUSUAL SHORTNESS OF BREATH *UNUSUAL BRUISING OR BLEEDING *URINARY PROBLEMS (pain or burning when urinating, or frequent urination) *BOWEL PROBLEMS (unusual diarrhea, constipation, pain near the anus) TENDERNESS IN MOUTH AND THROAT WITH OR WITHOUT PRESENCE OF ULCERS (sore throat, sores in mouth, or a toothache) UNUSUAL RASH, SWELLING OR PAIN  UNUSUAL VAGINAL DISCHARGE OR ITCHING   Items with * indicate a potential emergency and should be followed up as  soon as possible or go to the Emergency Department if any problems should occur.  Please show the CHEMOTHERAPY ALERT CARD or IMMUNOTHERAPY ALERT CARD at check-in to the Emergency Department and triage nurse.  Should you have questions after your visit or need to cancel or reschedule your appointment, please contact [facility] [place] - A DEPT OF [facility] [phone]  and follow the prompts.  Office hours are 8:00 a.m. to 4:30 p.m. Monday - Friday. Please note that voicemails left after 4:00 p.m. may not be returned until the following business day.  We are closed weekends and major holidays. You have access to a nurse at all times for urgent questions. Please call the main number to the clinic [phone] and follow the prompts.  For any non-urgent questions, you may also contact your provider using MyChart. We now offer e-Visits for anyone 66 and older to request care online for non-urgent symptoms. For details visit [email or web address].   Also download the MyChart app! Go to the app store, search MyChart, open the app, select [place], and log in with your MyChart username and password.

## 2024-01-22 LAB — T4: T4, Total: 8.9 ug/dL (ref 4.5–12.0)

## 2024-01-23 ENCOUNTER — Other Ambulatory Visit (HOSPITAL_COMMUNITY): Payer: Self-pay | Admitting: Student

## 2024-01-24 ENCOUNTER — Inpatient Hospital Stay: Admitting: Dietician

## 2024-01-24 ENCOUNTER — Other Ambulatory Visit: Payer: Self-pay

## 2024-01-24 ENCOUNTER — Ambulatory Visit (HOSPITAL_COMMUNITY)
Admission: RE | Admit: 2024-01-24 | Discharge: 2024-01-24 | Disposition: A | Discharge status: Home / Self Care | Source: Ambulatory Visit | Attending: Oncology | Admitting: Oncology

## 2024-01-24 ENCOUNTER — Emergency Department (HOSPITAL_COMMUNITY)

## 2024-01-24 ENCOUNTER — Encounter (HOSPITAL_COMMUNITY): Payer: Self-pay | Admitting: Emergency Medicine

## 2024-01-24 ENCOUNTER — Observation Stay (HOSPITAL_COMMUNITY)
Admission: EM | Admit: 2024-01-24 | Discharge: 2024-01-25 | Disposition: A | Discharge status: Home / Self Care | Attending: Internal Medicine | Admitting: Internal Medicine

## 2024-01-24 DIAGNOSIS — R17 Unspecified jaundice: Secondary | ICD-10-CM | POA: Diagnosis present

## 2024-01-24 DIAGNOSIS — Z79899 Other long term (current) drug therapy: Secondary | ICD-10-CM | POA: Insufficient documentation

## 2024-01-24 DIAGNOSIS — C228 Malignant neoplasm of liver, primary, unspecified as to type: Secondary | ICD-10-CM | POA: Diagnosis not present

## 2024-01-24 DIAGNOSIS — N1832 Chronic kidney disease, stage 3b: Secondary | ICD-10-CM | POA: Diagnosis not present

## 2024-01-24 DIAGNOSIS — I10 Essential (primary) hypertension: Secondary | ICD-10-CM | POA: Insufficient documentation

## 2024-01-24 DIAGNOSIS — E039 Hypothyroidism, unspecified: Secondary | ICD-10-CM | POA: Diagnosis not present

## 2024-01-24 DIAGNOSIS — Z6841 Body Mass Index (BMI) 40.0 and over, adult: Secondary | ICD-10-CM | POA: Insufficient documentation

## 2024-01-24 DIAGNOSIS — F419 Anxiety disorder, unspecified: Secondary | ICD-10-CM | POA: Insufficient documentation

## 2024-01-24 DIAGNOSIS — R7989 Other specified abnormal findings of blood chemistry: Secondary | ICD-10-CM | POA: Insufficient documentation

## 2024-01-24 DIAGNOSIS — D649 Anemia, unspecified: Secondary | ICD-10-CM | POA: Insufficient documentation

## 2024-01-24 DIAGNOSIS — N289 Disorder of kidney and ureter, unspecified: Secondary | ICD-10-CM

## 2024-01-24 DIAGNOSIS — Z66 Do not resuscitate: Secondary | ICD-10-CM

## 2024-01-24 DIAGNOSIS — E8809 Other disorders of plasma-protein metabolism, not elsewhere classified: Secondary | ICD-10-CM | POA: Diagnosis present

## 2024-01-24 DIAGNOSIS — N179 Acute kidney failure, unspecified: Secondary | ICD-10-CM | POA: Diagnosis not present

## 2024-01-24 DIAGNOSIS — Z515 Encounter for palliative care: Secondary | ICD-10-CM | POA: Diagnosis not present

## 2024-01-24 DIAGNOSIS — Z7189 Other specified counseling: Secondary | ICD-10-CM

## 2024-01-24 DIAGNOSIS — E7220 Disorder of urea cycle metabolism, unspecified: Secondary | ICD-10-CM | POA: Diagnosis not present

## 2024-01-24 DIAGNOSIS — R7401 Elevation of levels of liver transaminase levels: Secondary | ICD-10-CM | POA: Diagnosis not present

## 2024-01-24 DIAGNOSIS — Z558 Other problems related to education and literacy: Secondary | ICD-10-CM

## 2024-01-24 DIAGNOSIS — E46 Unspecified protein-calorie malnutrition: Secondary | ICD-10-CM | POA: Diagnosis present

## 2024-01-24 DIAGNOSIS — K7682 Hepatic encephalopathy: Secondary | ICD-10-CM | POA: Diagnosis not present

## 2024-01-24 DIAGNOSIS — N390 Urinary tract infection, site not specified: Secondary | ICD-10-CM | POA: Diagnosis not present

## 2024-01-24 DIAGNOSIS — J9611 Chronic respiratory failure with hypoxia: Secondary | ICD-10-CM | POA: Insufficient documentation

## 2024-01-24 DIAGNOSIS — K7581 Nonalcoholic steatohepatitis (NASH): Secondary | ICD-10-CM | POA: Insufficient documentation

## 2024-01-24 DIAGNOSIS — K219 Gastro-esophageal reflux disease without esophagitis: Secondary | ICD-10-CM | POA: Insufficient documentation

## 2024-01-24 DIAGNOSIS — R531 Weakness: Secondary | ICD-10-CM | POA: Diagnosis present

## 2024-01-24 DIAGNOSIS — D509 Iron deficiency anemia, unspecified: Secondary | ICD-10-CM | POA: Insufficient documentation

## 2024-01-24 DIAGNOSIS — C220 Liver cell carcinoma: Secondary | ICD-10-CM | POA: Diagnosis present

## 2024-01-24 LAB — CBC WITH DIFFERENTIAL/PLATELET
Abs Immature Granulocytes: 0.27 K/uL — ABNORMAL HIGH (ref 0.00–0.07)
Basophils Absolute: 0.1 K/uL (ref 0.0–0.1)
Basophils Relative: 1 %
Eosinophils Absolute: 0.9 K/uL — ABNORMAL HIGH (ref 0.0–0.5)
Eosinophils Relative: 9 %
HCT: 27.5 % — ABNORMAL LOW (ref 36.0–46.0)
Hemoglobin: 9.8 g/dL — ABNORMAL LOW (ref 12.0–15.0)
Immature Granulocytes: 3 %
Lymphocytes Relative: 20 %
Lymphs Abs: 1.9 K/uL (ref 0.7–4.0)
MCH: 33.1 pg (ref 26.0–34.0)
MCHC: 35.6 g/dL (ref 30.0–36.0)
MCV: 92.9 fL (ref 80.0–100.0)
Monocytes Absolute: 1 K/uL (ref 0.1–1.0)
Monocytes Relative: 11 %
Neutro Abs: 5.3 K/uL (ref 1.7–7.7)
Neutrophils Relative %: 56 %
Platelets: 341 K/uL (ref 150–400)
RBC: 2.96 MIL/uL — ABNORMAL LOW (ref 3.87–5.11)
RDW: 18 % — ABNORMAL HIGH (ref 11.5–15.5)
WBC: 9.5 K/uL (ref 4.0–10.5)
nRBC: 0 % (ref 0.0–0.2)

## 2024-01-24 LAB — AMMONIA: Ammonia: 68 umol/L — ABNORMAL HIGH (ref 9–35)

## 2024-01-24 LAB — FOLATE: Folate: 7.8 ng/mL (ref >5.9)

## 2024-01-24 LAB — COMPREHENSIVE METABOLIC PANEL WITH GFR
ALT: 130 U/L — ABNORMAL HIGH (ref 0–44)
AST: 367 U/L — ABNORMAL HIGH (ref 15–41)
Albumin: 1.5 g/dL — ABNORMAL LOW (ref 3.5–5.0)
Alkaline Phosphatase: 730 U/L — ABNORMAL HIGH (ref 38–126)
Anion gap: 15 (ref 5–15)
BUN: 50 mg/dL — ABNORMAL HIGH (ref 8–23)
CO2: 27 mmol/L (ref 22–32)
Calcium: 8.7 mg/dL — ABNORMAL LOW (ref 8.9–10.3)
Chloride: 90 mmol/L — ABNORMAL LOW (ref 98–111)
Creatinine, Ser: 1.9 mg/dL — ABNORMAL HIGH (ref 0.44–1.00)
GFR, Estimated: 29 mL/min — ABNORMAL LOW (ref >60)
Glucose, Bld: 98 mg/dL (ref 70–99)
Potassium: 4.4 mmol/L (ref 3.5–5.1)
Sodium: 132 mmol/L — ABNORMAL LOW (ref 135–145)
Total Bilirubin: 16.1 mg/dL — ABNORMAL HIGH (ref 0.0–1.2)
Total Protein: 5.6 g/dL — ABNORMAL LOW (ref 6.5–8.1)

## 2024-01-24 LAB — MAGNESIUM: Magnesium: 2.7 mg/dL — ABNORMAL HIGH (ref 1.7–2.4)

## 2024-01-24 LAB — IRON AND TIBC
Iron: 101 ug/dL (ref 28–170)
Saturation Ratios: 41 % — ABNORMAL HIGH (ref 10.4–31.8)
TIBC: 244 ug/dL — ABNORMAL LOW (ref 250–450)
UIBC: 143 ug/dL

## 2024-01-24 LAB — VITAMIN B12: Vitamin B-12: 2100 pg/mL — ABNORMAL HIGH (ref 180–914)

## 2024-01-24 LAB — FERRITIN: Ferritin: 475 ng/mL — ABNORMAL HIGH (ref 11–307)

## 2024-01-24 LAB — BRAIN NATRIURETIC PEPTIDE: B Natriuretic Peptide: 374 pg/mL — ABNORMAL HIGH (ref 0.0–100.0)

## 2024-01-24 LAB — PROTIME-INR
INR: 1.1 (ref 0.8–1.2)
Prothrombin Time: 14.4 s (ref 11.4–15.2)

## 2024-01-24 LAB — TSH: TSH: 15.399 u[IU]/mL — ABNORMAL HIGH (ref 0.350–4.500)

## 2024-01-24 MED ORDER — PANTOPRAZOLE SODIUM 40 MG PO TBEC
40.0000 mg | DELAYED_RELEASE_TABLET | Freq: Two times a day (BID) | ORAL | Status: DC
  Administered 2024-01-24 – 2024-01-25 (×3): 40 mg via ORAL
  Filled 2024-01-24 (×3): qty 1

## 2024-01-24 MED ORDER — LEVOTHYROXINE SODIUM 50 MCG PO TABS
50.0000 ug | ORAL_TABLET | Freq: Every day | ORAL | Status: DC
  Administered 2024-01-24 – 2024-01-25 (×2): 50 ug via ORAL
  Filled 2024-01-24 (×2): qty 1

## 2024-01-24 MED ORDER — ONDANSETRON HCL 4 MG/2ML IJ SOLN
4.0000 mg | Freq: Four times a day (QID) | INTRAMUSCULAR | Status: DC | PRN
  Administered 2024-01-24: 4 mg via INTRAVENOUS
  Filled 2024-01-24: qty 2

## 2024-01-24 MED ORDER — FUROSEMIDE 10 MG/ML IJ SOLN
40.0000 mg | Freq: Once | INTRAMUSCULAR | Status: AC
  Administered 2024-01-24: 40 mg via INTRAVENOUS
  Filled 2024-01-24: qty 4

## 2024-01-24 MED ORDER — PANTOPRAZOLE SODIUM 40 MG PO TBEC
40.0000 mg | DELAYED_RELEASE_TABLET | Freq: Every day | ORAL | Status: DC
Start: 2024-01-25 — End: 2024-01-25
  Administered 2024-01-25: 40 mg via ORAL
  Filled 2024-01-24: qty 1

## 2024-01-24 MED ORDER — LACTULOSE 10 GM/15ML PO SOLN
20.0000 g | Freq: Three times a day (TID) | ORAL | Status: DC
  Administered 2024-01-24 – 2024-01-25 (×5): 20 g via ORAL
  Filled 2024-01-24 (×5): qty 30

## 2024-01-24 MED ORDER — CHOLESTYRAMINE 4 G PO PACK
4.0000 g | PACK | Freq: Two times a day (BID) | ORAL | Status: DC
  Administered 2024-01-24: 4 g via ORAL
  Filled 2024-01-24 (×3): qty 1

## 2024-01-24 MED ORDER — ONDANSETRON HCL 4 MG PO TABS: 4.0000 mg | ORAL_TABLET | Freq: Four times a day (QID) | ORAL | Status: DC | PRN

## 2024-01-24 MED ORDER — HYDROXYZINE HCL 25 MG PO TABS: 50.0000 mg | ORAL_TABLET | Freq: Three times a day (TID) | ORAL | Status: DC | PRN

## 2024-01-24 MED ORDER — LEVOTHYROXINE SODIUM 50 MCG PO TABS: 50.0000 ug | ORAL_TABLET | Freq: Every day | ORAL | Status: DC

## 2024-01-24 MED ORDER — SODIUM CHLORIDE 0.9 % IV SOLN
1.0000 g | INTRAVENOUS | Status: DC
  Administered 2024-01-24: 1 g via INTRAVENOUS
  Filled 2024-01-24: qty 10

## 2024-01-24 MED ORDER — OXYCODONE HCL 5 MG PO TABS
5.0000 mg | ORAL_TABLET | Freq: Four times a day (QID) | ORAL | Status: DC | PRN
  Administered 2024-01-24 – 2024-01-25 (×4): 5 mg via ORAL
  Filled 2024-01-24 (×4): qty 1

## 2024-01-24 MED ORDER — LACTULOSE 10 GM/15ML PO SOLN
30.0000 g | Freq: Once | ORAL | Status: AC
  Administered 2024-01-24: 30 g via ORAL
  Filled 2024-01-24: qty 60

## 2024-01-24 MED ORDER — ENOXAPARIN SODIUM 40 MG/0.4ML IJ SOSY
40.0000 mg | PREFILLED_SYRINGE | INTRAMUSCULAR | Status: DC
  Administered 2024-01-24 – 2024-01-25 (×2): 40 mg via SUBCUTANEOUS
  Filled 2024-01-24 (×2): qty 0.4

## 2024-01-24 NOTE — ED Triage Notes (Signed)
 Per [facility], pt from  home, called out for incresed leg weakness and increased bilateral leg swelling.  Pt has stage 3 liver cancer and received her first treatment on Friday.  Pt also has a hx of CHF.   110/70 HR 60 95% on 2L (home O2)

## 2024-01-24 NOTE — H&P (Addendum)
 History and Physical    Patient: [name] FMW:[id number] DOB: 1955-06-04 DOA: 01/24/2024 DOS: the patient was seen and examined on 01/24/2024 PCP: Lari Elspeth BRAVO, MD  Patient coming from: Home  Chief Complaint:  Chief Complaint  Patient presents with   Weakness   bilateral leg swelling   HPI: [name] is a 68 y.o. female with medical history significant of hypertension, hepatic cirrhosis, hypothyroidism, GERD, hepatocellular carcinoma (follows with Dr. Davonna), morbid obesity who presents to the emergency department due to generalized weakness which has been ongoing since last 3 days.  Patient recently started chemotherapy on 9/19 and was noted to have had an  increased weakness from baseline weakness since the therapy.  Patient was reported to have difficulty in being able to get up from bed to bedside commode (different from baseline) and was also noted to have mild increased confusion within the last 24 hours, so it was decided for patient to go to ED for further evaluation.  She denies nausea or vomiting.  Husband at bedside states that patient has been having increased swelling in her legs despite being on Lasix .  ED Course: In the emergency department, BP was soft at 109/23, O2 sats was 94% on 2 LPM of oxygen, other vital signs were within normal range.  Workup in the ED showed WBC 11.5, hemoglobin 9.8, hematocrit 27.5, MCV 92.9, platelets 341.  BMP showed sodium 132, potassium 4.4, chloride 98, bicarb 27, blood glucose 98, BUN 50, creatinine 1.90 (baseline creatinine at 1.0), magnesium 2.7, ammonia 68.  BNP 374 Chest x-ray showed possible mild interstitial pulmonary edema.  No definite pleural effusions.  Suspected bibasilar atelectasis. IV Lasix  40 mg x 1 and lactulose  was given.  TR[facility] was asked to admit patient.  Review of Systems: Review of systems as noted in the HPI. All other systems reviewed and are negative.   Past Medical History:  Diagnosis Date   Arthritis     GERD (gastroesophageal reflux disease)    Hypertension    Obesity    Vaginal atrophy 05/27/2015   Vaginal irritation 05/27/2015   Past Surgical History:  Procedure Laterality Date   CATARACT EXTRACTION W/PHACO Right 06/28/2017   Procedure: CATARACT EXTRACTION PHACO AND INTRAOCULAR LENS PLACEMENT (IOC);  Surgeon: Perley Hamilton, MD;  Location: [facility] ORS;  Service: Ophthalmology;  Laterality: Right;  CDE: 7.94   CHOLECYSTECTOMY  07/09/2011   Procedure: LAPAROSCOPIC CHOLECYSTECTOMY WITH INTRAOPERATIVE CHOLANGIOGRAM;  Surgeon: Camellia CHRISTELLA Blush, MD,FACS;  Location: [facility] OR;  Service: General;  Laterality: N/A;  laparoscopic cholecystectomy with cholangiogram   ESOPHAGOGASTRODUODENOSCOPY N/A 01/05/2024   Procedure: EGD (ESOPHAGOGASTRODUODENOSCOPY);  Surgeon: Cindie Carlin POUR, DO;  Location: [facility] ENDO SUITE;  Service: Endoscopy;  Laterality: N/A;  1:30pm, ASA 4   IR RADIOLOGIST EVAL & MGMT  01/12/2024   JOINT REPLACEMENT     BILATERAL    TOTAL KNEE ARTHROPLASTY  2011    Social History:  reports that she has never smoked. She has never been exposed to tobacco smoke. She has never used smokeless tobacco. She reports that she does not drink alcohol and does not use drugs.   Allergies  Allergen Reactions   Penicillins Rash    Family History  Problem Relation Age of Onset   Cancer Mother        lung   Diabetes Father    Heart attack Paternal Grandmother    Heart attack Paternal Grandfather    Diabetes Brother      Prior to Admission medications  Medication Sig Start Date End Date Taking? Authorizing Provider  carvedilol  (COREG ) 3.125 MG tablet Take 1 tablet (3.125 mg total) by mouth 2 (two) times daily. 12/12/23 12/11/24  Pearlean Manus, MD  cholestyramine  (QUESTRAN ) 4 g packet Take 1 packet (4 g total) by mouth 2 (two) times daily. 12/27/23 01/26/24  Davonna Siad, MD  Cyanocobalamin  (VITAMIN B-12 PO) Take by mouth. Patient taking differently: Take by mouth daily at 6 (six) AM.    [provider]  folic acid  (FOLVITE ) 1 MG tablet Take 1 tablet (1 mg total) by mouth daily. 01/13/24   Davonna Siad, MD  furosemide  (LASIX ) 40 MG tablet Take 1 tablet (40 mg total) by mouth daily. 12/12/23 12/11/24  Pearlean Manus, MD  hydrOXYzine  (VISTARIL ) 50 MG capsule Take 1 capsule (50 mg total) by mouth at bedtime. 01/13/24   Davonna Siad, MD  lactulose  (CHRONULAC ) 10 GM/15ML solution Take 15 mLs (10 g total) by mouth daily. Patient taking differently: Take 10 g by mouth daily. 1 full tablespoon, and some time increases it to two Tablespoons depending on stool output. 12/12/23   Pearlean Manus, MD  levothyroxine  (SYNTHROID ) 50 MCG tablet Take 1 tablet (50 mcg total) by mouth daily before breakfast. 01/21/24   Davonna Siad, MD  lidocaine -prilocaine  (EMLA ) cream Apply to affected area once 01/14/24   Kandala, Hyndavi, MD  ondansetron  (ZOFRAN ) 8 MG tablet Take 1 tablet (8 mg total) by mouth every 8 (eight) hours as needed for nausea or vomiting. 01/14/24   Davonna Siad, MD  pantoprazole  (PROTONIX ) 40 MG tablet Take 1 tablet (40 mg total) by mouth daily. 12/29/23 12/28/24  Cindie Carlin POUR, DO  prochlorperazine  (COMPAZINE ) 10 MG tablet Take 1 tablet (10 mg total) by mouth every 6 (six) hours as needed for nausea or vomiting. 01/14/24   Davonna Siad, MD  sodium chloride  (OCEAN) 0.65 % SOLN nasal spray Place 1 spray into both nostrils as needed for congestion.    [provider]  spironolactone  (ALDACTONE ) 25 MG tablet Take 1 tablet (25 mg total) by mouth daily. 12/12/23 12/11/24  Pearlean Manus, MD  VITAMIN D PO Take by mouth. Patient taking differently: Take by mouth daily at 6 (six) AM.    [provider]    Physical Exam: BP 105/68 (BP Location: Left Wrist)   Pulse 62   Temp (!) 97.5 F (36.4 C) (Axillary)   Resp 18   Ht 5' 5 (1.651 m)   Wt 121.8 kg   SpO2 96%   BMI 44.68 kg/m   General: 68 y.o. year-old female chronically ill-appearing, jaundice, but  in no acute distress.  Alert and oriented x3. HEENT: NCAT, EOMI Neck: Supple, trachea medial Cardiovascular: Regular rate and rhythm with no rubs or gallops.  No thyromegaly or JVD noted.  +3 lower extremity edema bilaterally. 2/4 pulses in all 4 extremities. Respiratory: Clear to auscultation with no wheezes or rales. Good inspiratory effort. Abdomen: Soft, nontender nondistended with normal bowel sounds x4 quadrants. Muskuloskeletal: No cyanosis, clubbing noted bilaterally Neuro: CN II-XII intact, strength 5/5 x 4, sensation, reflexes intact Skin: No ulcerative lesions noted or rashes Psychiatry: Judgement and insight appear normal. Mood is appropriate for condition and setting          Labs on Admission:  Basic Metabolic Panel: Recent Labs  Lab 01/21/24 1007 01/24/24 0256  NA 131* 132*  K 4.3 4.4  CL 87* 90*  CO2 29 27  GLUCOSE 95 98  BUN 44* 50*  CREATININE 1.91* 1.90*  CALCIUM 8.7* 8.7*  MG  --  2.7*   Liver Function Tests: Recent Labs  Lab 01/21/24 1007 01/24/24 0256  AST 417* 367*  ALT 135* 130*  ALKPHOS 793* 730*  BILITOT 16.1* 16.1*  PROT 5.6* 5.6*  ALBUMIN 1.6* 1.5*   No results for input(s): LIPASE, AMYLASE in the last 168 hours. Recent Labs  Lab 01/24/24 0256  AMMONIA 68*   CBC: Recent Labs  Lab 01/21/24 1007 01/24/24 0256  WBC 8.1 9.5  NEUTROABS 4.6 5.3  HGB 9.5* 9.8*  HCT 28.6* 27.5*  MCV 93.8 92.9  PLT 420* 341   Cardiac Enzymes: No results for input(s): CKTOTAL, CKMB, CKMBINDEX, TROPONINI in the last 168 hours.  BNP (last 3 results) Recent Labs    12/09/23 1214 01/24/24 0256  BNP 236.0* 374.0*    ProBNP (last 3 results) No results for input(s): PROBNP in the last 8760 hours.  CBG: No results for input(s): GLUCAP in the last 168 hours.  Radiological Exams on Admission: DG Chest Port 1 View Result Date: 01/24/2024 EXAM: 1 VIEW XRAY OF THE CHEST 01/24/2024 03:33:17 AM COMPARISON: 12/09/2023 CLINICAL HISTORY:  Weakness. Per [facility], pt from home, called out for increased leg weakness and increased bilateral leg swelling. Pt has stage 3 liver cancer and received her first treatment on Friday. Pt also has a hx of CHF. FINDINGS: LUNGS AND PLEURA: Increasing interstitial markings, favoring mild interstitial edema. Suspected bibasilar atelectasis. No definite pleural effusions. No pneumothorax. HEART AND MEDIASTINUM: No acute abnormality of the cardiac and mediastinal silhouettes. BONES AND SOFT TISSUES: Old right humeral head/neck fracture deformity. No acute osseous abnormality. IMPRESSION: 1. Possible mild interstitial pulmonary edema.  No definite pleural effusions. 2. Suspected bibasilar atelectasis. Electronically signed by: Pinkie Pebbles MD 01/24/2024 03:39 AM EDT RP Workstation: [id number]    EKG: I independently viewed the EKG done and my findings are as followed: Normal sinus rhythm at a rate of 64 bpm  Assessment/Plan Present on Admission:  Hepatic encephalopathy (HCC)  Hypoalbuminemia due to protein-calorie malnutrition (HCC)  Hepatocellular carcinoma (HCC)  Jaundice  Principal Problem:   Hepatic encephalopathy (HCC) Active Problems:   Hepatocellular carcinoma (HCC)   Jaundice   Hypoalbuminemia due to protein-calorie malnutrition (HCC)   Hyperammonemia (HCC)   Transaminasemia   AKI (acute kidney injury) (HCC)   Normocytic anemia   Iron deficiency anemia   Elevated brain natriuretic peptide (BNP) level   Acquired hypothyroidism   Essential hypertension   GERD (gastroesophageal reflux disease)  Hepatic encephalopathy Hyperammonemia Ammonia 68; this may be responsive for patient's encephalopathy Apparently patient has been taking 1.5 tablespoons of lactulose  once daily, just for her to be able to have soft stool. Lactulose  30 g was given, continue lactulose  20 g 3 times daily and titrate to 2-3 loose stools daily Continue to monitor ammonia levels  Transaminasemia Hepatic  cirrhosis AST 367, ALT 130, ALP 730 This is possibly due to patient cirrhotic state She was initially diagnosed with cirrhosis since 2013 possibly due to fatty liver and NASH Continue to monitor liver enzymes  Acute kidney injury Creatinine 1.90 (baseline creatinine at 1.0) Hold fluids at this time due to peripheral edema Renally adjust medications, avoid nephrotoxic agents/dehydration/hypotension  Normocytic/iron deficiency anemia MCV 92.9; patient also have iron deficiency component due to the elevated RDW at 18.0 Iron studies will be done  Elevated BNP BNP 374 (this was 236 about a month ago) Patient presents with peripheral edema Chest x-ray suggestive of possible mild interstitial pulmonary edema Echocardiogram done on  12/11/2023 showed LVEF of 70 to 75%.  No RWMA.  LV diastolic parameters were normal Continue total input/output, daily weights and fluid restriction Lasix  will be held at this time due to soft BP  Hypoalbuminemia Albumin 1.5; this is possibly secondary to malnutrition and liver cirrhosis  Hepatocellular carcinoma Patient follows with Dr. Davonna She had full session of atezolizumab  on 9/19  Jaundice Total bilirubin 16.1, ALP also elevated at 730 MRCP done on 12/10/2023 showed innumerable liver lesions Continue cholestyramine   Acquired hypothyroidism Continue Synthroid   Essential hypertension BP meds will be held at this time due to soft BP  GERD Continue Protonix   DVT prophylaxis: Lovenox   Code Status: DNR  Family Communication: Husband and nephew at bedside (all questions answered to satisfaction)  Consults: None  Severity of Illness: The appropriate patient status for this patient is INPATIENT. Inpatient status is judged to be reasonable and necessary in order to provide the required intensity of service to ensure the patient's safety. The patient's presenting symptoms, physical exam findings, and initial radiographic and laboratory data in the  context of their chronic comorbidities is felt to place them at high risk for further clinical deterioration. Furthermore, it is not anticipated that the patient will be medically stable for discharge from the hospital within 2 midnights of admission.   * I certify that at the point of admission it is my clinical judgment that the patient will require inpatient hospital care spanning beyond 2 midnights from the point of admission due to high intensity of service, high risk for further deterioration and high frequency of surveillance required.*  Author: Lorine Iannaccone, DO 01/24/2024 7:25 AM  For on call review [email or web address].

## 2024-01-24 NOTE — Progress Notes (Addendum)
 PROGRESS NOTE  [name] FMW:[id number] DOB: 12-11-1955 DOA: 01/24/2024 PCP: Lari Elspeth BRAVO, MD  Brief History:  68 year old female with a history of NASH liver cirrhosis, hypertension, hepatocellular carcinoma, folic acid  deficiency, presents with generalized weakness and peripheral edema.  The patient is weak to the point where she is having difficulty making a transfer out of bed to the commode.  Family also notes that the patient has been more confused.  The patient received Atezolizumab  plus bevacizumab on 01/21/2024.  She has had worsening weakness since that time.  In the ED, the patient was afebrile hemodynamically stable with oxygen saturation 96% 2 L.  Sodium 132, potassium 4.4, bicarbonate 27, serum creatinine 1.90.  BNP 374.  WBC 9.5, hemoglobin 9.5, platelets 341.  Ammonia 68.  AST 367, ALT 130, alk phosphatase 730, total bilirubin 16.1.  Chest x-ray showed increased interstitial markings and bibasilar atelectasis.  EKG showed sinus rhythm with nonspecific T wave changes.  The patient was given a dose of IV furosemide  and started on lactulose .  She was admitted for further evaluation and treatment.  The patient was recently hospitalized from 12/09/2023 to 12/12/2023.  During that hospitalization, the patient was treated for acute respiratory failure secondary to fluid overload.  Was felt to be secondary to her liver disease.  He was discharged home with furosemide  40 mg daily, spironolactone  25 mg daily, and lactulose  10 g daily.  She was also placed on Coreg  3.125 mg twice daily.  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 Davonna -Atezolizumab  plus bevacizumab on 01/21/2024.  Hypothyroidism - Continue Synthroid   Anxiety - Continue Vistaril   Morbid obesity - BMI 44.68 - Lifestyle modification  Chronic respiratory failure with hypoxia - Chronically on 2 L nasal cannula      Family Communication:   spouse at bedside 9/22  Consultants:  palliative  Code Status:  DNR  DVT Prophylaxis:  [place] Lovenox    Procedures: As Listed in Progress Note Above  Antibiotics: None       Subjective: Patient complains of pain all over.  She denies any shortness breath, vomiting, diarrhea.  Objective: Vitals:   01/24/24 0256 01/24/24 0439 01/24/24 0445 01/24/24 0550  BP:   (!) 112/55 105/68  Pulse:  61 63 62  Resp:  12 13 18   Temp:    (!) 97.5 F (36.4 C)  TempSrc:    Axillary  SpO2:  93% 92% 96%  Weight: 119 kg   121.8 kg  Height: 5' 5 (1.651 m)   5' 5 (1.651 m)   No intake or output data in the 24 hours ending 01/24/24 0915 Weight change:  Exam:  General:  Pt is alert, follows commands appropriately, not in acute distress HEENT: No icterus, No  thrush, No neck mass, [place]/AT Cardiovascular: RRR, S1/S2, no rubs, no gallops Respiratory: Bilateral crackles.  No wheezing. Abdomen: Soft/+BS, non tender, non distended, no guarding Extremities: 2 + LE edema, No lymphangitis, No petechiae, No rashes, no synovitis   Data Reviewed: I have personally reviewed following labs and imaging studies Basic Metabolic Panel: Recent Labs  Lab  01/21/24 1007 01/24/24 0256  NA 131* 132*  K 4.3 4.4  CL 87* 90*  CO2 29 27  GLUCOSE 95 98  BUN 44* 50*  CREATININE 1.91* 1.90*  CALCIUM 8.7* 8.7*  MG  --  2.7*   Liver Function Tests: Recent Labs  Lab 01/21/24 1007 01/24/24 0256  AST 417* 367*  ALT 135* 130*  ALKPHOS 793* 730*  BILITOT 16.1* 16.1*  PROT 5.6* 5.6*  ALBUMIN 1.6* 1.5*   No results for input(s): LIPASE, AMYLASE in the last 168 hours. Recent Labs  Lab 01/24/24 0256  AMMONIA 68*   Coagulation Profile: No results for input(s): INR, PROTIME in the last 168 hours. CBC: Recent Labs  Lab 01/21/24 1007 01/24/24 0256  WBC 8.1 9.5  NEUTROABS 4.6 5.3  HGB 9.5* 9.8*  HCT 28.6* 27.5*  MCV 93.8 92.9  PLT 420* 341   Cardiac Enzymes: No results for input(s): CKTOTAL, CKMB, CKMBINDEX, TROPONINI in the last 168 hours. BNP: Invalid input(s): POCBNP CBG: No results for input(s): GLUCAP in the last 168 hours. HbA1C: No results for input(s): HGBA1C in the last 72 hours. Urine analysis:    Component Value Date/Time   COLORURINE AMBER (A) 01/21/2024 1008   APPEARANCEUR HAZY (A) 01/21/2024 1008   LABSPEC 1.016 01/21/2024 1008   PHURINE 5.0 01/21/2024 1008   GLUCOSEU NEGATIVE 01/21/2024 1008   HGBUR SMALL (A) 01/21/2024 1008   BILIRUBINUR MODERATE (A) 01/21/2024 1008   BILIRUBINUR large 01/10/2013 1111   KETONESUR NEGATIVE 01/21/2024 1008   PROTEINUR NEGATIVE 01/21/2024 1008   UROBILINOGEN 4.0 01/10/2013 1111   UROBILINOGEN >8.0 (H) 06/13/2011 1942   NITRITE POSITIVE (A) 01/21/2024 1008   LEUKOCYTESUR SMALL (A) 01/21/2024 1008   Sepsis Labs: @LABRCNTIP (procalcitonin:4,lacticidven:4) )No results found for this or any previous visit (from the past 240 hours).   Scheduled Meds:  enoxaparin  (LOVENOX ) injection  40 mg Subcutaneous Q24H   furosemide   40 mg Intravenous Once   lactulose   20 g Oral TID   levothyroxine   50 mcg Oral Q0600   pantoprazole   40 mg Oral BID   Continuous  Infusions:  Procedures/Studies: DG Chest Port 1 View Result Date: 01/24/2024 EXAM: 1 VIEW XRAY OF THE CHEST 01/24/2024 03:33:17 AM COMPARISON: 12/09/2023 CLINICAL HISTORY: Weakness. Per [facility], pt from home, called out for increased leg weakness and increased bilateral leg swelling. Pt has stage 3 liver cancer and received her first treatment on Friday. Pt also has a hx of CHF. FINDINGS: LUNGS AND PLEURA: Increasing interstitial markings, favoring mild interstitial edema. Suspected bibasilar atelectasis. No definite pleural effusions. No pneumothorax. HEART AND MEDIASTINUM: No acute abnormality of the cardiac and mediastinal silhouettes. BONES AND SOFT TISSUES: Old right humeral head/neck fracture deformity. No acute osseous abnormality. IMPRESSION: 1. Possible mild interstitial pulmonary edema.  No definite pleural effusions. 2. Suspected bibasilar atelectasis. Electronically signed by: Pinkie Pebbles MD 01/24/2024 03:39 AM EDT RP Workstation: [id number]   IR Radiologist Eval & Mgmt Result Date: 01/12/2024 : See note in Epic. Electronically Signed   By: Juliene Balder M.D.   On: 01/12/2024 19:57   CT Chest W Contrast Addendum Date: 01/10/2024 ADDENDUM REPORT: 01/10/2024 14:13 ADDENDUM: Not  included above are multiple nonacute anterior right rib fractures, including third and fourth. The fourth fracture may be subacute, including on 61/2. Electronically Signed   By: Rockey Kilts M.D.   On: 01/10/2024 14:13   Result Date: 01/10/2024 CLINICAL DATA:  Staging of hepatocellular carcinoma. * Tracking Code: BO * EXAM: CT CHEST WITH CONTRAST TECHNIQUE: Multidetector CT imaging of the chest was performed during intravenous contrast administration. RADIATION DOSE REDUCTION: This exam was performed according to the departmental dose-optimization program which includes automated exposure control, adjustment of the mA and/or kV according to patient size and/or use of iterative reconstruction technique. CONTRAST:  80mL  OMNIPAQUE  IOHEXOL  300 MG/ML  SOLN COMPARISON:  MRI of the abdomen of 12/10/2023. Chest radiograph of 12/09/2023 no prior chest CT. FINDINGS: Cardiovascular: Aortic atherosclerosis. Tortuous thoracic aorta. Mild cardiomegaly, without pericardial effusion. Lad and right coronary artery calcification. No central pulmonary embolism, on this non-dedicated study. Mediastinum/Nodes: No mediastinal or hilar adenopathy. Subtle periesophageal varices Lungs/Pleura: Small left and trace right pleural effusions. Dependent bibasilar airspace disease is new since the prior MRI. Left upper lobe 4 mm calcified granuloma on 59/3. Bilateral relatively well-circumscribed/rounded pulmonary nodules are identified on series 3. Example 5 mm in the superior segment left lower lobe on 47/3. Within the right lower lobe at 4 mm on image 61/3. Within the anterior right middle lobe at 3 mm on 76/3. 5 x 5 mm in the inferior right middle lobe on 96/3. Upper Abdomen: Hepatic replacement by innumerable masses, as before. Portosystemic collaterals. Normal imaged portions of the spleen, stomach, pancreas, adrenal glands, kidneys. Musculoskeletal: Anasarca.  Mild osteopenia. IMPRESSION: 1. Multiple bilateral pulmonary nodules of maximally 5 mm. Technically indeterminate but suspicious for early pulmonary metastasis. Consider chest CT follow-up at 6 months. 2. New tiny bilateral pleural effusions with dependent bibasilar airspace disease, new since the prior MRI. Dependent airspace disease could represent compressive atelectasis or early infection. 3. No thoracic adenopathy 4. Incidental findings, including: Coronary artery atherosclerosis. Aortic Atherosclerosis (ICD10-I70.0). Electronically Signed: By: Rockey Kilts M.D. On: 01/10/2024 13:51    Alm Schneider, DO  [facility]  If 7PM-7AM, please contact night-coverage [email or web address] Password [email or web address] 01/24/2024, 9:15 AM   LOS: 0 days

## 2024-01-24 NOTE — TOC Initial Note (Signed)
 Transition of Care [facility]) - Initial/Assessment Note    Patient Details  Name: [name] MRN: [id number] Date of Birth: 12-31-1955  [facility] CM/SW Contact:    Sharlyne Stabs, RN Phone Number: 01/24/2024, 12:56 PM  Clinical Narrative:        Patient admitted with Hepatic encephalopathy.  Discharged howe with spouse in August with home oxygen provided by [employer]. Palliative consulted for goals of care. TOC following.             Barriers to Discharge: Continued Medical Work up  Patient Goals and CMS Choice Patient states their goals for this hospitalization and ongoing recovery are:: Palliative to discuss goals     Expected Discharge Plan and Services      Living arrangements for the past 2 months: Single Family Home                   Prior Living Arrangements/Services Living arrangements for the past 2 months: Single Family Home Lives with:: Spouse   Activities of Daily Living   ADL Screening (condition at time of admission) Independently performs ADLs?: No Does the patient have a NEW difficulty with bathing/dressing/toileting/self-feeding that is expected to last >3 days?: No Does the patient have a NEW difficulty with getting in/out of bed, walking, or climbing stairs that is expected to last >3 days?: No Does the patient have a NEW difficulty with communication that is expected to last >3 days?: No Is the patient deaf or have difficulty hearing?: No Does the patient have difficulty seeing, even when wearing glasses/contacts?: No Does the patient have difficulty concentrating, remembering, or making decisions?: No  Permission Sought/Granted        Admission diagnosis:  Hepatic encephalopathy (HCC) [K76.82] Renal insufficiency [N28.9] Elevated liver function tests [R79.89] Normochromic normocytic anemia [D64.9] Patient Active Problem List   Diagnosis Date Noted   Hepatic encephalopathy (HCC) 01/24/2024   Hyperammonemia (HCC) 01/24/2024    Transaminasemia 01/24/2024   AKI (acute kidney injury) (HCC) 01/24/2024   Normocytic anemia 01/24/2024   Iron deficiency anemia 01/24/2024   Elevated brain natriuretic peptide (BNP) level 01/24/2024   Acquired hypothyroidism 01/24/2024   Essential hypertension 01/24/2024   GERD (gastroesophageal reflux disease) 01/24/2024   Chronic respiratory failure with hypoxia (HCC) 01/24/2024   Anxiety 01/13/2024   Folate deficiency 01/13/2024   Hypoalbuminemia due to protein-calorie malnutrition (HCC) 01/13/2024   Jaundice 12/27/2023   Hepatocellular carcinoma (HCC) 12/26/2023   CHF (congestive heart failure) (HCC) 12/09/2023   Liver cirrhosis secondary to NASH (HCC) 12/09/2023   Acute respiratory failure with hypoxia (HCC) 12/09/2023   Obesity, Class III, BMI 40-49.9 (morbid obesity) 12/09/2023   Liver mass, right lobe 12/09/2023   Leg swelling 12/09/2023   Vaginal irritation 05/27/2015   Vaginal itching 05/27/2015   Vaginal atrophy 05/27/2015   PCP:  Lari Elspeth BRAVO, MD Pharmacy:   [facility] [address], [place] - [address] [place] [address] Phone: [phone] Fax: [phone]     Social Drivers of Health (SDOH) Social History: SDOH Screenings   Food Insecurity: No Food Insecurity (01/24/2024)  Housing: Low Risk  (01/24/2024)  Transportation Needs: No Transportation Needs (01/24/2024)  Utilities: Not At Risk (01/24/2024)  Depression (PHQ2-9): Medium Risk (01/13/2024)  Social Connections: Socially Integrated (01/24/2024)  Tobacco Use: Low Risk  (01/24/2024)   SDOH Interventions:     Readmission Risk Interventions    01/24/2024   12:54 PM  Readmission Risk Prevention Plan  Transportation  Screening Complete  PCP or Specialist Appt within 3-5 Days Not Complete  HRI or Home Care Consult Complete  Social Work Consult for Recovery Care Planning/Counseling Complete  Palliative Care Screening Not Complete  Palliative Care Screening Not Complete Comments  Ordered - will see today  Medication Review Oceanographer) Complete

## 2024-01-24 NOTE — Discharge Summary (Signed)
 Physician Discharge Summary   Patient: [name] MRN: [id number] DOB: 1956-01-08  Admit date:     01/24/2024  Discharge date: 01/25/2024  Discharge Physician: Alm Jathniel Smeltzer   PCP: Lari Elspeth BRAVO, MD   Recommendations at discharge:   Please follow up with primary care provider within 1-2 weeks  Please repeat BMP and CBC in one week    Hospital Course: 68 year old female with a history of NASH liver cirrhosis, hypertension, hepatocellular carcinoma, folic acid  deficiency, presents with generalized weakness and peripheral edema.  The patient is weak to the point where she is having difficulty making a transfer out of bed to the commode.  Family also notes that the patient has been more confused.  The patient received Atezolizumab  plus bevacizumab on 01/21/2024.  She has had worsening weakness since that time.  In the ED, the patient was afebrile hemodynamically stable with oxygen saturation 96% 2 L.  Sodium 132, potassium 4.4, bicarbonate 27, serum creatinine 1.90.  BNP 374.  WBC 9.5, hemoglobin 9.5, platelets 341.  Ammonia 68.  AST 367, ALT 130, alk phosphatase 730, total bilirubin 16.1.  Chest x-ray showed increased interstitial markings and bibasilar atelectasis.  EKG showed sinus rhythm with nonspecific T wave changes.  The patient was given a dose of IV furosemide  and started on lactulose .  She was admitted for further evaluation and treatment.  The patient was recently hospitalized from 12/09/2023 to 12/12/2023.  During that hospitalization, the patient was treated for acute respiratory failure secondary to fluid overload.  Was felt to be secondary to her liver disease.  He was discharged home with furosemide  40 mg daily, spironolactone  25 mg daily, and lactulose  10 g daily.  She was also placed on Coreg  3.125 mg twice daily.  Evaluated by IR and is not a candidate for Y90 radioembolization  The patient was admitted for further workup and treatment of her generalized weakness.  This was felt  to be multifactorial including AKI, FTT, decompensated liver cirrhosis and her hepatic encephalopathy.  In addition, the patient recently received immunotherapy on 01/21/2024 which has also contributed to her worsening weakness.  The patient and spouse had expressed desire not to pursue any further therapy.  In addition, given the patient's gradual worsening of her renal function and need for further diuresis, the patient will likely be faced with hepatorenal syndrome.  The patient was initially started on lactulose , ceftriaxone , and given a dose of IV furosemide .  Palliative medicine was consulted to assist with goals of care discussions.  After meeting with the patient and spouse, they desire to be discharged home with comfort measures care and hospice to follow.  TOC assisted in the transition to home hospice.   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 Davonna -Atezolizumab  plus bevacizumab on 01/21/2024.   Hypothyroidism -  Continue Synthroid    Anxiety - Continue Vistaril    Morbid obesity - BMI 44.68 - Lifestyle modification   Chronic respiratory failure with hypoxia - Chronically  on 2 L nasal cannula   {Tip this will not be part of the note when signed Body mass index is 44.68 kg/m. , ,  (Optional):26781}   Consultants: palliative medicine Procedures performed: none  Disposition: Home Diet recommendation:  Comfort feeds DISCHARGE MEDICATION: Allergies as of 01/24/2024       Reactions   Penicillins Rash     Med Rec must be completed prior to using this [facility]***       Discharge Exam: Filed Weights   01/24/24 0256 01/24/24 0550  Weight: 119 kg 121.8 kg   HEENT:  /AT, No thrush, no icterus CV:  RRR, no rub, no S3, no S4 Lung:  bibasilar crackles. No wheeze Abd:  soft/+BS, NT Ext:  2+ LE edema, no lymphangitis, no synovitis, no rash   Condition at discharge: stable  The results of significant diagnostics from this hospitalization (including imaging, microbiology, ancillary and laboratory) are listed below for reference.   Imaging Studies: DG Chest Port 1 View Result Date: 01/24/2024 EXAM: 1 VIEW XRAY OF THE CHEST 01/24/2024 03:33:17 AM COMPARISON: 12/09/2023 CLINICAL HISTORY: Weakness. Per [facility], pt from home, called out for increased leg weakness and increased bilateral leg swelling. Pt has stage 3 liver cancer and received her first treatment on Friday. Pt also has a hx of CHF. FINDINGS: LUNGS AND PLEURA: Increasing interstitial markings, favoring mild interstitial edema. Suspected bibasilar atelectasis. No definite pleural effusions. No pneumothorax. HEART AND MEDIASTINUM: No acute abnormality of the cardiac and mediastinal silhouettes. BONES AND SOFT TISSUES: Old right humeral head/neck fracture deformity. No acute osseous abnormality. IMPRESSION: 1. Possible mild interstitial pulmonary edema.  No definite pleural effusions. 2. Suspected bibasilar atelectasis. Electronically signed by: Pinkie Pebbles MD 01/24/2024 03:39 AM EDT RP Workstation: [id number]   IR Radiologist Eval & Mgmt Result Date: 01/12/2024 : See note in Epic. Electronically  Signed   By: Juliene Balder M.D.   On: 01/12/2024 19:57   CT Chest W Contrast Addendum Date: 01/10/2024 ADDENDUM REPORT: 01/10/2024 14:13 ADDENDUM: Not included above are multiple nonacute anterior right rib fractures, including third and fourth. The fourth fracture may be subacute, including on 61/2. Electronically Signed   By: Rockey Kilts M.D.   On: 01/10/2024 14:13   Result Date: 01/10/2024 CLINICAL DATA:  Staging of hepatocellular carcinoma. * Tracking Code: BO * EXAM: CT CHEST WITH CONTRAST TECHNIQUE: Multidetector CT imaging of the chest was performed during intravenous contrast administration. RADIATION DOSE REDUCTION: This exam was performed according to the departmental dose-optimization program which includes automated exposure control, adjustment of the mA and/or kV according to patient size and/or use of iterative reconstruction technique. CONTRAST:  80mL OMNIPAQUE  IOHEXOL  300 MG/ML  SOLN COMPARISON:  MRI of the abdomen of 12/10/2023. Chest radiograph of 12/09/2023 no prior chest CT. FINDINGS: Cardiovascular: Aortic atherosclerosis. Tortuous thoracic aorta. Mild cardiomegaly, without pericardial effusion. Lad and right coronary artery calcification. No central pulmonary embolism, on this non-dedicated study. Mediastinum/Nodes: No mediastinal or hilar adenopathy. Subtle periesophageal varices Lungs/Pleura: Small left and trace right pleural effusions. Dependent bibasilar airspace disease is new since the prior MRI. Left upper lobe 4 mm calcified granuloma on 59/3. Bilateral relatively well-circumscribed/rounded pulmonary nodules are identified on series 3. Example 5 mm in the superior segment left lower lobe on 47/3. Within the right lower lobe at 4 mm on image 61/3. Within the  anterior right middle lobe at 3 mm on 76/3. 5 x 5 mm in the inferior right middle lobe on 96/3. Upper Abdomen: Hepatic replacement by innumerable masses, as before. Portosystemic collaterals. Normal imaged portions of the spleen,  stomach, pancreas, adrenal glands, kidneys. Musculoskeletal: Anasarca.  Mild osteopenia. IMPRESSION: 1. Multiple bilateral pulmonary nodules of maximally 5 mm. Technically indeterminate but suspicious for early pulmonary metastasis. Consider chest CT follow-up at 6 months. 2. New tiny bilateral pleural effusions with dependent bibasilar airspace disease, new since the prior MRI. Dependent airspace disease could represent compressive atelectasis or early infection. 3. No thoracic adenopathy 4. Incidental findings, including: Coronary artery atherosclerosis. Aortic Atherosclerosis (ICD10-I70.0). Electronically Signed: By: Rockey Kilts M.D. On: 01/10/2024 13:51    Microbiology: Results for orders placed or performed in visit on 10/06/14  Aerobic culture     Status: Abnormal   Collection Time: 10/06/14 10:48 AM   Specimen: Skin   SKIN ABDOMEN  Result Value Ref Range Status   Aerobic Bacterial Culture Final report (A)  Final   Organism ID, Bacteria Comment (A)  Final    Comment: Bacillus species, not Bacillus anthracis Recovered from broth only. Susceptibility not normally performed on this organism.     Labs: CBC: Recent Labs  Lab 01/21/24 1007 01/24/24 0256  WBC 8.1 9.5  NEUTROABS 4.6 5.3  HGB 9.5* 9.8*  HCT 28.6* 27.5*  MCV 93.8 92.9  PLT 420* 341   Basic Metabolic Panel: Recent Labs  Lab 01/21/24 1007 01/24/24 0256  NA 131* 132*  K 4.3 4.4  CL 87* 90*  CO2 29 27  GLUCOSE 95 98  BUN 44* 50*  CREATININE 1.91* 1.90*  CALCIUM 8.7* 8.7*  MG  --  2.7*   Liver Function Tests: Recent Labs  Lab 01/21/24 1007 01/24/24 0256  AST 417* 367*  ALT 135* 130*  ALKPHOS 793* 730*  BILITOT 16.1* 16.1*  PROT 5.6* 5.6*  ALBUMIN 1.6* 1.5*   CBG: No results for input(s): GLUCAP in the last 168 hours.  Discharge time spent: greater than 30 minutes.  Signed: Alm Schneider, MD [facility] 01/24/2024

## 2024-01-24 NOTE — Consult Note (Signed)
 Consultation Note Date: 01/24/2024   Patient Name: [name]  DOB: [birth year]  MRN: [id number]  Age / Sex: 68 y.o., female  PCP: Lari Elspeth BRAVO, MD Referring Physician: Evonnie Lenis, MD  Reason for Consultation: Establishing goals of care  HPI/Patient Profile: 68 y.o. female  with past medical history of NASH liver cirrhosis, hypertension, hepatocellular carcinoma (followed by Dr. Davonna), folic acid  deficiency admitted on 01/24/2024 with generalized weakness and edema.   Workup revealed some hepatic encephalopathy for which she was treated with lactulose  3 times daily.  Also noted to have AKI in the setting of acute on chronic renal disease/hepatorenal syndrome.  Baseline creatinine 1.1-1.6 presenting with a serum creatinine of 1.9.  Patient recently started on cancer treatment.  Hospitalized twice over the past 6 months 1 unplanned hospitalization for acute hypoxic respiratory failure and then planned hospitalization for upper endoscopy.  Prior hospital encounters and outside specialist notes reviewed. I see where patient was seen by oncology who noted recent diagnosis of hepatocellular carcinoma innumerable lesions in the right lobe of the liver as well as small nodules in the lung that are possibly related to cancer.  Treatment options were discussed.  Patient was started on  Atezolizumab  and Bevacizumab for palliative intent.  First infusion was on 01/21/2024.  PMT has been consulted to assist with goals of care conversation.  Today, met with patient and family at bedside. Labs independently reviewed.  Mild hyponatremia noted.  Renal function elevated (creatinine 1.90, BUN 50, eGFR 29, comparison 01/21/2024: Creatinine 1.91, BUN 44, eGFR 28).  Continue to monitor.  LFTs significantly elevated and total bilirubin 16.1 consistent with severe liver dysfunction secondary to NASH and extensive hepatocellular carcinoma.  INR 1.1.  MELD NA score 27 with presented 27 to  32% 90-day mortality rate.  Mildly low calcium level in the setting of hypoalbuminemia.  Corrected calcium 10.7 mg/dL.  Albumin level 1.5. Low albumin levels are associated with greater disease burden and poorer long-term prognosis.  CBC reviewed.  Normal white count.  Hemoglobin 9.8 g/dL.  Hemoglobin has been in the 9 range since September.  Platelet count normal.  Would recommend trending.  EKG independently reviewed.  Appears to be in normal sinus rhythm with occasional PACs.  QTc prolonged at 460.  Would use caution with QTc prolonging medications.  Medications reviewed.  Patient required 1 dose of Zofran  4 mg on 24-hour look back and total of 5 mg of oxycodone  24-hour look good.  Otherwise, no as needed symptom meds administered.  Vital signs reviewed and appear relatively stable.  She does require O2 via nasal cannula to maintain O2 sats.  Patient states she is doing okay and willing to further discuss treatment plan and goals of care.  Clinical Assessment and Goals of Care:  I have reviewed medical records including EPIC notes, labs and imaging (independently reviewed), outside specialist notes, prior hospital encounters, MAR, vital signs, assessed the patient and then met with patient and family to discuss diagnosis prognosis, GOC, EOL wishes, disposition and options. Collaborated directly with attending physician, TOC, and bedside nursing staff.   I introduced Palliative Medicine as specialized medical care for people living with serious illness. It focuses on providing relief from the symptoms and stress of a serious illness. The goal is to improve quality of life for both the patient and the family.  We discussed a brief life review of the patient and then focused on their current illness.   I attempted to elicit values and goals of  care important to the patient.    Medical History Review and Family/Patient Understanding:   Patient and family have a good understanding of patient's current  health condition as well as her recent cancer diagnosis and the prognosis of this. Engaged in a detailed conversation with the patient and family about the seriousness of her current illness, in light of her complex medical history, and explained the potential implications this may have for her recovery and long-term well-being.  We discussed patient's cancer treatment and that it is currently being used for palliative intent.  Discussed what it means for cancer treatment to be palliative versus curative.  Family verbalized understanding.  Social History:  Patient lives at home with her husband.  Her nephew and his wife are supportive.  Functional and Nutritional State:  Patient has had declining functional status over the past few weeks and needing more more assistance.  She was doing okay and with assistance was able to assist with caring for herself.  However, they report that when she took her most recent chemo this caused profound weakness and inability to get out of bed.  Palliative Symptoms:  Pain  Advance care planning Discussion:  The patient and family consented to a voluntary Advance Care Planning Conversation in person. Individuals present for the conversation: Patient, this nurse practitioner, husband Prentice Duster, nephew Tanda Ee, and nephew's wife.  A detailed discussion regarding GOC, advanced directives, and anticipatory care planning was had. I discussed with her our concerns regarding the progression of her chronic condition in the context of her existing comorbidities, and explained how this could potentially impact her future health.  We also discussed that patient and family no longer desire for her to receive cancer treatments.  They understand that the intent was palliative and feel that the treatment made her feel worse versus feeling better.  Therefore they do not desire any further treatment.  They understand her condition is terminal.  Goals of care discussed.   Patient wants to be at home and comfortable, no longer wishes to pursue cancer therapy, and avoid future hospitalizations but is concerned about her family having to take care of her especially providing incontinent care as she is not able to get out of bed.  They wonder about having a device such as the PureWick to help manage urinary incontinence and husband willing to manage bowel incontinence if he is able to receive training from about how to appropriately change and reposition a bedbound patient.  They definitely do not want her to be placed in a long-term care facility.  Given goals of care, discussed that hospice would be appropriate. Reviewed hospice philosophy of care, including focus on comfort, quality of life, and support for patients and families facing terminal illness. Discussed eligibility criteria and potential benefits of enrollment.  Also discussed various locations where hospice care can occur (i.e. home, LTC with hospice, versus inpatient hospice) and what care would look like at each of these levels as well as potential cost associated.  They expressed an interest in home hospice with potential referral to inpatient hospice depending on patient status and if she meets criteria.  We discussed medications under hospice benefit including the discontinuation of noncomfort meds (although using shared decision making regarding certain meds), and the initiation of comfort meds including potentially opioids and benzodiazepines.  Reassured patient and family that symptoms would be managed aggressively with hospice.  Family and patient are reassured by this as they certainly want her to be comfortable.  Preferred hospice agency would be [facility].  DME needs include hospital bed and PureWick.  Patient would need transfer home via EMS. DNR/DNI confirmed.  Discussed the importance of continued conversation with family and the medical providers regarding overall plan of care and treatment options, ensuring  decisions are within the context of the patient's values and GOCs.   Questions and concerns were addressed.  The family was encouraged to call with questions or concerns.  PMT will continue to support holistically.  Primary Decision maker and health care surrogate:  PATIENT, husband if patient unable to speak for herself  Code Status:  DNR/DNI  Outcome of the conversations: Patient to discharge with hospice services for end-of-life/comfort care  I spent 35 minutes providing separately identifiable ACP services with the patient and/or surrogate decision maker in a voluntary, in-person conversation discussing the patient's wishes and goals as detailed in the above note.  SUMMARY OF RECOMMENDATIONS    DNR/DNI GOC clarified and goal is to return home and focus on comfort quality of life Given GOC family elects hospice benefit (preference [facility]) TOC to assist with arranging hospice and arranging for necessary equipment Symptoms stable at present, therefore continue symptom regimen per admitting team with PMT available as needed for support Palliative medicine team will continue to follow for ongoing goals of care discussion, symptom management, and coordination of care.  Code Status/Advance Care Planning: DNR   Symptom Management:  Per attending team with PMT available for support as needed  Prognosis:  < 6 months  Discharge Planning: Home with Hospice      Primary Diagnoses: Present on Admission:  Hepatic encephalopathy (HCC)  Hypoalbuminemia due to protein-calorie malnutrition (HCC)  Hepatocellular carcinoma (HCC)  Jaundice    Physical Exam Constitutional:      General: She is not in acute distress.    Appearance: She is ill-appearing.  Pulmonary:     Effort: Pulmonary effort is normal. No respiratory distress.  Skin:    Coloration: Skin is jaundiced.     Findings: Bruising present.  Neurological:     Mental Status: She is alert.     Vital Signs: BP 123/61  (BP Location: Left Wrist)   Pulse 60   Temp (!) 97.5 F (36.4 C) (Axillary)   Resp 18   Ht 5' 5 (1.651 m)   Wt 121.8 kg   SpO2 95%   BMI 44.68 kg/m  Pain Scale: 0-10   Pain Score: 5    SpO2: SpO2: 95 % O2 Device:SpO2: 95 % O2 Flow Rate: .O2 Flow Rate (L/min): 3 L/min   Palliative Assessment/Data: 30-40%     Billing based on MDM: High  Problems Addressed: One acute or chronic illness or injury that poses a threat to life or bodily function  Amount and/or Complexity of Data: Category 1:Review of prior external note(s) from each unique source and Category 3:Discussion of management or test interpretation with external physician/other qualified health care professional/appropriate source (not separately reported)  Risks: n/a   Laymon CHRISTELLA Pinal, NP  Palliative Medicine Team Team phone # [phone]  Thank you for allowing the Palliative Medicine Team to assist in the care of this patient. Please utilize secure chat with additional questions, if there is no response within 30 minutes please call the above phone number.  Palliative Medicine Team providers are available by phone from 7am to 7pm daily and can be reached through the team cell phone.  Should this patient require assistance outside of these hours, please call the patient's attending  physician.

## 2024-01-24 NOTE — Plan of Care (Signed)
   Problem: Education: Goal: Knowledge of General Education information will improve Description: Including pain rating scale, medication(s)/side effects and non-pharmacologic comfort measures Outcome: Progressing   Problem: Clinical Measurements: Goal: Ability to maintain clinical measurements within normal limits will improve Outcome: Progressing Goal: Diagnostic test results will improve Outcome: Progressing

## 2024-01-24 NOTE — ED Provider Notes (Signed)
 [name]   CSN: [id number] Arrival date & time: 01/24/24  0241     Patient presents with: Weakness and bilateral leg swelling   [name] is a 68 y.o. female.   The history is provided by the patient.  Weakness  She has a history of hypertension, GERD, liver cirrhosis, hepatocellular carcinoma and recently started on chemotherapy for same comes in because of ongoing and worsening peripheral edema.  He she is feeling generally weak but denies chest pain or dyspnea.  She denies nausea or vomiting.  She has been taking furosemide , but is having progressive swelling in spite of that.    Prior to Admission medications   Medication Sig Start Date End Date Taking? Authorizing Provider  carvedilol  (COREG ) 3.125 MG tablet Take 1 tablet (3.125 mg total) by mouth 2 (two) times daily. 12/12/23 12/11/24  Pearlean Manus, MD  cholestyramine  (QUESTRAN ) 4 g packet Take 1 packet (4 g total) by mouth 2 (two) times daily. 12/27/23 01/26/24  Davonna Siad, MD  Cyanocobalamin  (VITAMIN B-12 PO) Take by mouth. Patient taking differently: Take by mouth daily at 6 (six) AM.    [provider]  folic acid  (FOLVITE ) 1 MG tablet Take 1 tablet (1 mg total) by mouth daily. 01/13/24   Kandala, Hyndavi, MD  furosemide  (LASIX ) 40 MG tablet Take 1 tablet (40 mg total) by mouth daily. 12/12/23 12/11/24  Pearlean Manus, MD  hydrOXYzine  (VISTARIL ) 50 MG capsule Take 1 capsule (50 mg total) by mouth at bedtime. 01/13/24   Davonna Siad, MD  lactulose  (CHRONULAC ) 10 GM/15ML solution Take 15 mLs (10 g total) by mouth daily. Patient taking differently: Take 10 g by mouth daily. 1 full tablespoon, and some time increases it to two Tablespoons depending on stool output. 12/12/23   Pearlean Manus, MD  levothyroxine  (SYNTHROID ) 50 MCG tablet Take 1 tablet (50 mcg total) by mouth daily before breakfast. 01/21/24   Davonna Siad, MD   lidocaine -prilocaine  (EMLA ) cream Apply to affected area once 01/14/24   Kandala, Hyndavi, MD  ondansetron  (ZOFRAN ) 8 MG tablet Take 1 tablet (8 mg total) by mouth every 8 (eight) hours as needed for nausea or vomiting. 01/14/24   Davonna Siad, MD  pantoprazole  (PROTONIX ) 40 MG tablet Take 1 tablet (40 mg total) by mouth daily. 12/29/23 12/28/24  Cindie Carlin POUR, DO  prochlorperazine  (COMPAZINE ) 10 MG tablet Take 1 tablet (10 mg total) by mouth every 6 (six) hours as needed for nausea or vomiting. 01/14/24   Davonna Siad, MD  sodium chloride  (OCEAN) 0.65 % SOLN nasal spray Place 1 spray into both nostrils as needed for congestion.    [provider]  spironolactone  (ALDACTONE ) 25 MG tablet Take 1 tablet (25 mg total) by mouth daily. 12/12/23 12/11/24  Pearlean Manus, MD  VITAMIN D PO Take by mouth. Patient taking differently: Take by mouth daily at 6 (six) AM.    [provider]    Allergies: Penicillins    Review of Systems  Neurological:  Positive for weakness.  All other systems reviewed and are negative.   Updated Vital Signs There were no vitals taken for this visit.  Physical Exam Vitals and nursing Glass reviewed.   68 year old female, resting comfortably and in no acute distress. Vital signs are normal. Oxygen saturation is 94%, which is normal. Head is normocephalic and atraumatic. PERRLA, EOMI.  Marked scleral icterus noted. Neck is nontender and supple. Back is nontender, there is  no presacral edema. Lungs are clear without rales, wheezes, or rhonchi. Chest is nontender. Heart has regular rate and rhythm without murmur. Abdomen is soft, flat, nontender. Extremities have 3+ edema. Skin is warm and dry without rash. Neurologic: Awake but slow to answer questions, oriented x 3.  Cranial nerves are intact.  Moves all extremities equally.  Asterixis noted.  (all labs ordered are listed, but only abnormal results are displayed) Labs Reviewed - No data  to display  EKG: ED ECG REPORT   Date: 01/24/2024  Rate: 64  Rhythm: normal sinus rhythm and premature atrial contractions (PAC)  QRS Axis: normal  Intervals: normal  ST/T Wave abnormalities: normal  Conduction Disutrbances:nonspecific intraventricular conduction delay  Narrative Interpretation: Sinus rhythm with occasional PAC, low voltage, nonspecific intraventricular conduction delay.  When compared with ECG of 12/09/2023, no significant changes are seen except for presence of PACs today.  Old EKG Reviewed: unchanged  I have personally reviewed the EKG tracing and agree with the computerized printout as noted.  Radiology: No results found.   Procedures   Medications Ordered in the ED - No data to display                                  Medical Decision Making Amount and/or Complexity of Data Reviewed Labs: ordered. Radiology: ordered.  Risk Prescription drug management. Decision regarding hospitalization.   Progressive edema in patient with history of cirrhosis and hepatocellular carcinoma.  This a presentation with a wide range of treatment options and carries with it a high risk of morbidity and complications.  Differential diagnosis includes, but is not limited to, progressive cirrhosis, hypoalbuminemia, heart failure.  I have reviewed her past records, and noted recent diagnosis of hepatocellular carcinoma and received her first infusion of atezolizumab  on 9/19, also scheduled to start on bevacizumab.  I have ordered screening labs including BNP and also ammonia level.  I have reviewed her electrocardiogram, and my interpretation is low voltage, sinus rhythm with PAC, nonspecific intraventricular conduction delay, no ST or T changes and unchanged from prior except for presence of PAC.  I have ordered a dose of furosemide  intravenously.  4:29 AM Family has arrived, and states that in addition to progressive swelling, she has been extremely weak to the point where she  could not even transfer to a commode tonight.  Also, she was very confused, which is new.  She is taking lactulose  but only 1 tablespoonful of the day.  I have reviewed her laboratory tests, my interpretation is mild hyponatremia which is not felt to be clinically significant at but stable, stable renal insufficiency, stable elevated transaminases and alkaline phosphatase and bilirubin, elevated BNP, stable anemia.  I have ordered a dose of lactulose .  She will need to be admitted for management of hepatic encephalopathy, diuresis, and also may need consideration for transfer to's skilled nursing facility if strength does not improve.  I have discussed the case with Dr. Manfred of [facility], who agrees to admit the patient.  CRITICAL CARE Performed by: Alm Lias Total critical care time: 35 minutes Critical care time was exclusive of separately billable procedures and treating other patients. Critical care was necessary to treat or prevent imminent or life-threatening deterioration. Critical care was time spent personally by me on the following activities: development of treatment plan with patient and/or surrogate as well as nursing, discussions with consultants, evaluation of patient's response to treatment, examination  of patient, obtaining history from patient or surrogate, ordering and performing treatments and interventions, ordering and review of laboratory studies, ordering and review of radiographic studies, pulse oximetry and re-evaluation of patient's condition.     Final diagnoses:  Hepatic encephalopathy (HCC)  Renal insufficiency  Elevated liver function tests  Normochromic normocytic anemia    ED Discharge Orders     None          Raford Lenis, MD 01/24/24 [phone]

## 2024-01-24 NOTE — Hospital Course (Addendum)
 68 year old female with a history of NASH liver cirrhosis, hypertension, hepatocellular carcinoma, folic acid  deficiency, presents with generalized weakness and peripheral edema.  The patient is weak to the point where she is having difficulty making a transfer out of bed to the commode.  Family also notes that the patient has been more confused.  The patient received Atezolizumab  plus bevacizumab on 01/21/2024.  She has had worsening weakness since that time.  In the ED, the patient was afebrile hemodynamically stable with oxygen saturation 96% 2 L.  Sodium 132, potassium 4.4, bicarbonate 27, serum creatinine 1.90.  BNP 374.  WBC 9.5, hemoglobin 9.5, platelets 341.  Ammonia 68.  AST 367, ALT 130, alk phosphatase 730, total bilirubin 16.1.  Chest x-ray showed increased interstitial markings and bibasilar atelectasis.  EKG showed sinus rhythm with nonspecific T wave changes.  The patient was given a dose of IV furosemide  and started on lactulose .  She was admitted for further evaluation and treatment.  The patient was recently hospitalized from 12/09/2023 to 12/12/2023.  During that hospitalization, the patient was treated for acute respiratory failure secondary to fluid overload.  Was felt to be secondary to her liver disease.  He was discharged home with furosemide  40 mg daily, spironolactone  25 mg daily, and lactulose  10 g daily.  She was also placed on Coreg  3.125 mg twice daily.  Evaluated by IR and is not a candidate for Y90 radioembolization  The patient was admitted for further workup and treatment of her generalized weakness.  This was felt to be multifactorial including AKI, FTT, decompensated liver cirrhosis and her hepatic encephalopathy.  In addition, the patient recently received immunotherapy on 01/21/2024 which has also contributed to her worsening weakness.  The patient and spouse had expressed desire not to pursue any further therapy.  In addition, given the patient's gradual worsening of her  renal function and need for further diuresis, the patient will likely be faced with hepatorenal syndrome.  The patient was initially started on lactulose , ceftriaxone , and given a dose of IV furosemide .  Palliative medicine was consulted to assist with goals of care discussions.  After meeting with the patient and spouse, they desire to be discharged home with comfort measures care and hospice to follow.  TOC assisted in the transition to home hospice.

## 2024-01-25 DIAGNOSIS — C220 Liver cell carcinoma: Secondary | ICD-10-CM | POA: Diagnosis not present

## 2024-01-25 DIAGNOSIS — N179 Acute kidney failure, unspecified: Secondary | ICD-10-CM | POA: Diagnosis not present

## 2024-01-25 DIAGNOSIS — J9611 Chronic respiratory failure with hypoxia: Secondary | ICD-10-CM | POA: Diagnosis not present

## 2024-01-25 DIAGNOSIS — K7682 Hepatic encephalopathy: Secondary | ICD-10-CM | POA: Diagnosis not present

## 2024-01-25 LAB — CBC
HCT: 26.7 % — ABNORMAL LOW (ref 36.0–46.0)
Hemoglobin: 9.2 g/dL — ABNORMAL LOW (ref 12.0–15.0)
MCH: 32.3 pg (ref 26.0–34.0)
MCHC: 34.5 g/dL (ref 30.0–36.0)
MCV: 93.7 fL (ref 80.0–100.0)
Platelets: 353 K/uL (ref 150–400)
RBC: 2.85 MIL/uL — ABNORMAL LOW (ref 3.87–5.11)
RDW: 18 % — ABNORMAL HIGH (ref 11.5–15.5)
WBC: 10.1 K/uL (ref 4.0–10.5)
nRBC: 0 % (ref 0.0–0.2)

## 2024-01-25 LAB — COMPREHENSIVE METABOLIC PANEL WITH GFR
ALT: 123 U/L — ABNORMAL HIGH (ref 0–44)
AST: 313 U/L — ABNORMAL HIGH (ref 15–41)
Albumin: <1.5 g/dL — ABNORMAL LOW (ref 3.5–5.0)
Alkaline Phosphatase: 724 U/L — ABNORMAL HIGH (ref 38–126)
Anion gap: 14 (ref 5–15)
BUN: 51 mg/dL — ABNORMAL HIGH (ref 8–23)
CO2: 28 mmol/L (ref 22–32)
Calcium: 8.4 mg/dL — ABNORMAL LOW (ref 8.9–10.3)
Chloride: 88 mmol/L — ABNORMAL LOW (ref 98–111)
Creatinine, Ser: 2.04 mg/dL — ABNORMAL HIGH (ref 0.44–1.00)
GFR, Estimated: 26 mL/min — ABNORMAL LOW (ref >60)
Glucose, Bld: 79 mg/dL (ref 70–99)
Potassium: 4.1 mmol/L (ref 3.5–5.1)
Sodium: 130 mmol/L — ABNORMAL LOW (ref 135–145)
Total Bilirubin: 15.7 mg/dL — ABNORMAL HIGH (ref 0.0–1.2)
Total Protein: 5.3 g/dL — ABNORMAL LOW (ref 6.5–8.1)

## 2024-01-25 LAB — PHOSPHORUS: Phosphorus: 4.7 mg/dL — ABNORMAL HIGH (ref 2.5–4.6)

## 2024-01-25 LAB — MAGNESIUM: Magnesium: 2.7 mg/dL — ABNORMAL HIGH (ref 1.7–2.4)

## 2024-01-25 LAB — PROTIME-INR
INR: 1.1 (ref 0.8–1.2)
Prothrombin Time: 14.3 s (ref 11.4–15.2)

## 2024-01-25 MED ORDER — FENTANYL CITRATE PF 50 MCG/ML IJ SOSY
12.5000 ug | PREFILLED_SYRINGE | Freq: Once | INTRAMUSCULAR | Status: AC
  Administered 2024-01-25: 12.5 ug via INTRAVENOUS
  Filled 2024-01-25: qty 1

## 2024-01-25 MED ORDER — OXYCODONE HCL 5 MG PO TABS: 5.0000 mg | ORAL_TABLET | Freq: Four times a day (QID) | ORAL | 0 refills | Status: AC | PRN

## 2024-01-25 NOTE — TOC Transition Note (Signed)
 Transition of Care [facility]) - Discharge Note   Patient Details  Name: [name] MRN: [id number] Date of Birth: 1955/12/16  Transition of Care [facility] CM/SW Contact:  Sharlyne Stabs, RN Phone Number: 01/25/2024, 11:16 AM   Clinical Narrative:   [facility] accepted the patient and ordered Hospital bed. [facility] will deliver today between 1-3:00 pm. EMS schedule for 4PM pick up to transport home. TOC found a female Pure Wick system at the [facility] in [place]. CM at the bedside to update. Patient's nephew, [name] is leaving to go pick up the purewick system. Team, [facility], and family updated with discharge plan.    Final next level of care: Home w Hospice Care Barriers to Discharge: Barriers Resolved   Patient Goals and CMS Choice Patient states their goals for this hospitalization and ongoing recovery are:: Home with hospice     Discharge Placement               Patient to be transferred to facility by: EMS Name of family member notified: Spouse Patient and family notified of of transfer: 01/25/24  Discharge Plan and Services Additional resources added to the After Visit Summary for        Social Drivers of Health (SDOH) Interventions SDOH Screenings   Food Insecurity: No Food Insecurity (01/24/2024)  Housing: Low Risk  (01/24/2024)  Transportation Needs: No Transportation Needs (01/24/2024)  Utilities: Not At Risk (01/24/2024)  Depression (PHQ2-9): Medium Risk (01/13/2024)  Social Connections: Socially Integrated (01/24/2024)  Tobacco Use: Low Risk  (01/24/2024)    Readmission Risk Interventions    01/24/2024   12:54 PM  Readmission Risk Prevention Plan  Transportation Screening Complete  PCP or Specialist Appt within 3-5 Days Not Complete  HRI or Home Care Consult Complete  Social Work Consult for Recovery Care Planning/Counseling Complete  Palliative Care Screening Not Complete  Palliative Care Screening Not Complete Comments Ordered - will see today  Medication  Review Oceanographer) Complete

## 2024-01-25 NOTE — TOC Progression Note (Signed)
 Transition of Care [facility]) - Progression Note    Patient Details  Name: [name] MRN: [id number] Date of Birth: 04/15/1956  Transition of Care [facility]) CM/SW Contact  Sharlyne Stabs, RN Phone Number: 01/25/2024, 9:09 AM  Clinical Narrative:   Palliative consulted TOC with wishes for patient to go home with [facility]. They will need a hospital bed, they want a pure wick system and patient will need EMS for transport. CM spoke with [facility], they are working on equipment. The pure wick system is self  pay, they will discuss with family.     Expected Discharge Plan: Home w [facility] Barriers to Discharge:  (DME delivery)      Expected Discharge Plan and Services       Living arrangements for the past 2 months: Single Family Home        Social Drivers of Health (SDOH) Interventions SDOH Screenings   Food Insecurity: No Food Insecurity (01/24/2024)  Housing: Low Risk  (01/24/2024)  Transportation Needs: No Transportation Needs (01/24/2024)  Utilities: Not At Risk (01/24/2024)  Depression (PHQ2-9): Medium Risk (01/13/2024)  Social Connections: Socially Integrated (01/24/2024)  Tobacco Use: Low Risk  (01/24/2024)    Readmission Risk Interventions    01/24/2024   12:54 PM  Readmission Risk Prevention Plan  Transportation Screening Complete  PCP or Specialist Appt within 3-5 Days Not Complete  HRI or Home Care Consult Complete  Social Work Consult for Recovery Care Planning/Counseling Complete  Palliative Care Screening Not Complete  Palliative Care Screening Not Complete Comments Ordered - will see today  Medication Review Oceanographer) Complete

## 2024-01-25 NOTE — Plan of Care (Signed)

## 2024-01-26 LAB — URINE CULTURE: Culture: >=100000 — AB

## 2024-02-10 ENCOUNTER — Inpatient Hospital Stay

## 2024-02-10 ENCOUNTER — Inpatient Hospital Stay: Admitting: Oncology

## 2024-03-04 DEATH — deceased

## 2024-03-29 ENCOUNTER — Other Ambulatory Visit: Payer: Self-pay
